# Patient Record
Sex: Female | Born: 1980 | Race: White | Hispanic: No | Marital: Married | Smoking: Never smoker
Health system: Southern US, Community
[De-identification: ages and names within clinical notes are randomized; demographics above are authoritative.]

## PROBLEM LIST (undated history)

## (undated) DIAGNOSIS — D649 Anemia, unspecified: Secondary | ICD-10-CM

## (undated) DIAGNOSIS — D693 Immune thrombocytopenic purpura: Secondary | ICD-10-CM

## (undated) DIAGNOSIS — J45909 Unspecified asthma, uncomplicated: Secondary | ICD-10-CM

## (undated) HISTORY — PX: APPENDECTOMY: SHX54

## (undated) HISTORY — PX: SPLENECTOMY, TOTAL: SHX788

## (undated) HISTORY — DX: Anemia, unspecified: D64.9

## (undated) HISTORY — PX: TONSILLECTOMY: SUR1361

## (undated) HISTORY — DX: Immune thrombocytopenic purpura: D69.3

---

## 2006-08-18 ENCOUNTER — Ambulatory Visit: Payer: Self-pay | Admitting: Internal Medicine

## 2006-09-02 ENCOUNTER — Ambulatory Visit: Payer: Self-pay | Admitting: Internal Medicine

## 2006-10-01 ENCOUNTER — Ambulatory Visit: Payer: Self-pay | Admitting: Internal Medicine

## 2006-10-05 ENCOUNTER — Ambulatory Visit: Payer: Self-pay | Admitting: Internal Medicine

## 2006-11-01 ENCOUNTER — Ambulatory Visit: Payer: Self-pay | Admitting: Internal Medicine

## 2007-03-03 ENCOUNTER — Ambulatory Visit: Payer: Self-pay | Admitting: Internal Medicine

## 2007-03-07 ENCOUNTER — Ambulatory Visit: Payer: Self-pay | Admitting: Internal Medicine

## 2007-04-03 ENCOUNTER — Ambulatory Visit: Payer: Self-pay | Admitting: Internal Medicine

## 2007-05-13 ENCOUNTER — Emergency Department: Payer: Self-pay

## 2007-09-03 ENCOUNTER — Ambulatory Visit: Payer: Self-pay | Admitting: Internal Medicine

## 2007-09-07 ENCOUNTER — Ambulatory Visit: Payer: Self-pay | Admitting: Internal Medicine

## 2007-10-01 ENCOUNTER — Ambulatory Visit: Payer: Self-pay | Admitting: Internal Medicine

## 2008-01-01 ENCOUNTER — Ambulatory Visit: Payer: Self-pay | Admitting: Internal Medicine

## 2008-01-05 ENCOUNTER — Ambulatory Visit: Payer: Self-pay | Admitting: Internal Medicine

## 2008-01-31 ENCOUNTER — Ambulatory Visit: Payer: Self-pay | Admitting: Internal Medicine

## 2008-03-02 ENCOUNTER — Ambulatory Visit: Payer: Self-pay | Admitting: Internal Medicine

## 2008-04-02 ENCOUNTER — Ambulatory Visit: Payer: Self-pay | Admitting: Internal Medicine

## 2008-05-02 ENCOUNTER — Ambulatory Visit: Payer: Self-pay | Admitting: Internal Medicine

## 2008-05-22 ENCOUNTER — Ambulatory Visit: Payer: Self-pay | Admitting: Internal Medicine

## 2008-05-22 ENCOUNTER — Emergency Department: Payer: Self-pay | Admitting: Emergency Medicine

## 2008-06-02 ENCOUNTER — Ambulatory Visit: Payer: Self-pay | Admitting: Internal Medicine

## 2008-07-02 ENCOUNTER — Ambulatory Visit: Payer: Self-pay | Admitting: Internal Medicine

## 2008-08-02 ENCOUNTER — Ambulatory Visit: Payer: Self-pay | Admitting: Internal Medicine

## 2008-09-02 ENCOUNTER — Ambulatory Visit: Payer: Self-pay | Admitting: Internal Medicine

## 2008-09-03 ENCOUNTER — Ambulatory Visit: Payer: Self-pay | Admitting: Internal Medicine

## 2008-09-30 ENCOUNTER — Ambulatory Visit: Payer: Self-pay | Admitting: Internal Medicine

## 2008-10-31 ENCOUNTER — Ambulatory Visit: Payer: Self-pay | Admitting: Internal Medicine

## 2008-11-30 ENCOUNTER — Ambulatory Visit: Payer: Self-pay | Admitting: Internal Medicine

## 2008-12-31 ENCOUNTER — Ambulatory Visit: Payer: Self-pay | Admitting: Internal Medicine

## 2009-01-30 ENCOUNTER — Ambulatory Visit: Payer: Self-pay | Admitting: Internal Medicine

## 2009-03-02 ENCOUNTER — Ambulatory Visit: Payer: Self-pay | Admitting: Internal Medicine

## 2009-05-02 ENCOUNTER — Ambulatory Visit: Payer: Self-pay | Admitting: Internal Medicine

## 2009-05-06 ENCOUNTER — Ambulatory Visit: Payer: Self-pay | Admitting: Internal Medicine

## 2009-06-02 ENCOUNTER — Ambulatory Visit: Payer: Self-pay | Admitting: Internal Medicine

## 2009-07-02 ENCOUNTER — Ambulatory Visit: Payer: Self-pay | Admitting: Internal Medicine

## 2009-08-02 ENCOUNTER — Ambulatory Visit: Payer: Self-pay | Admitting: Internal Medicine

## 2009-09-02 ENCOUNTER — Ambulatory Visit: Payer: Self-pay | Admitting: Internal Medicine

## 2009-09-19 ENCOUNTER — Ambulatory Visit: Payer: Self-pay | Admitting: Internal Medicine

## 2009-09-30 ENCOUNTER — Ambulatory Visit: Payer: Self-pay | Admitting: Internal Medicine

## 2010-01-30 ENCOUNTER — Ambulatory Visit: Payer: Self-pay | Admitting: Internal Medicine

## 2010-02-12 ENCOUNTER — Ambulatory Visit: Payer: Self-pay | Admitting: Internal Medicine

## 2010-03-02 ENCOUNTER — Ambulatory Visit: Payer: Self-pay | Admitting: Internal Medicine

## 2010-05-27 ENCOUNTER — Ambulatory Visit: Payer: Self-pay | Admitting: Internal Medicine

## 2010-06-02 ENCOUNTER — Ambulatory Visit: Payer: Self-pay | Admitting: Internal Medicine

## 2010-08-10 IMAGING — CT CT HEAD WITHOUT CONTRAST
1 series · 15 of 30 positions shown, 19 images · non-contrast
Comparison: none

REASON FOR EXAM: frontal HA     low platelets   CALL report    [id number]
COMMENTS:

[Series 2: soft tissue · axial · 0.41mm/px · z∈[-33,+102]mm · 15 of 31 slices shown, 19 images]
[im 2/31  brain]
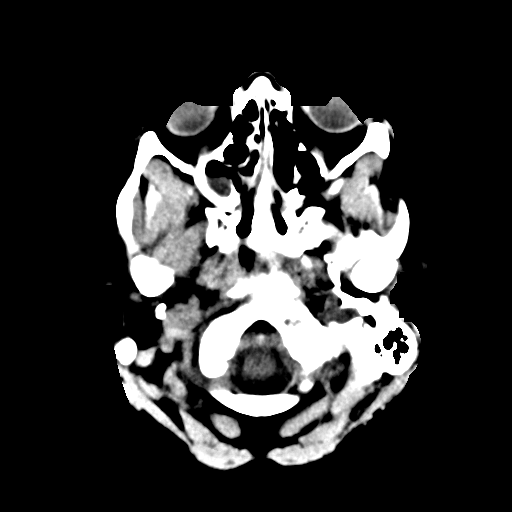
[im 2/31  bone]
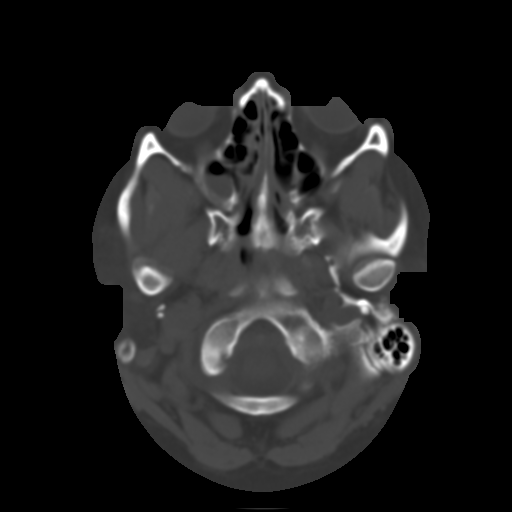
[im 4/31  brain]
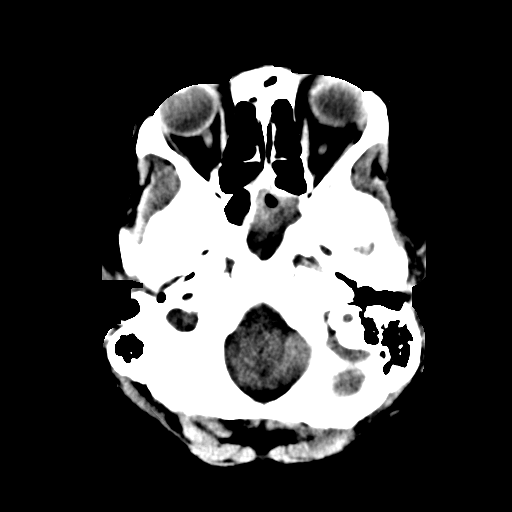
[im 6/31  brain]
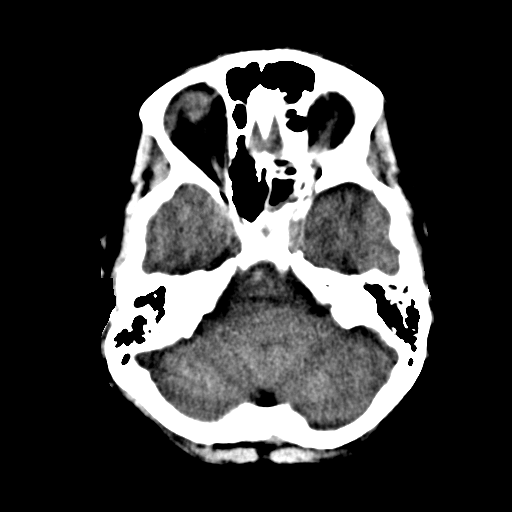
[im 8/31  brain]
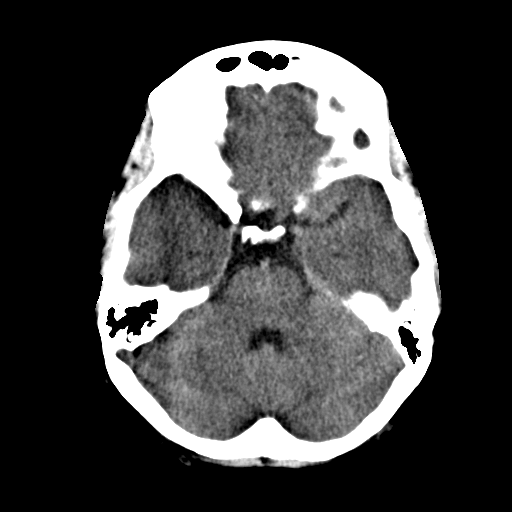
[im 10/31  brain]
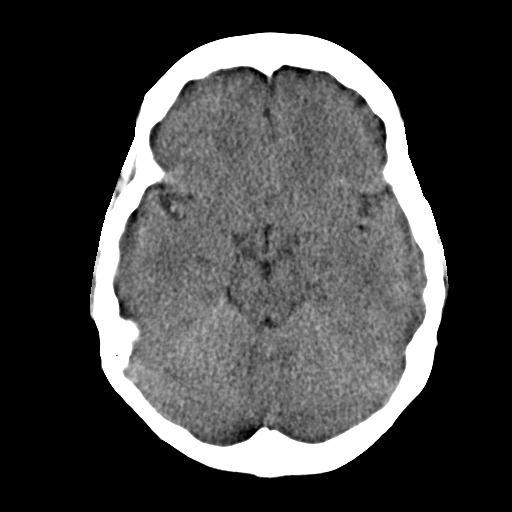
[im 10/31  bone]
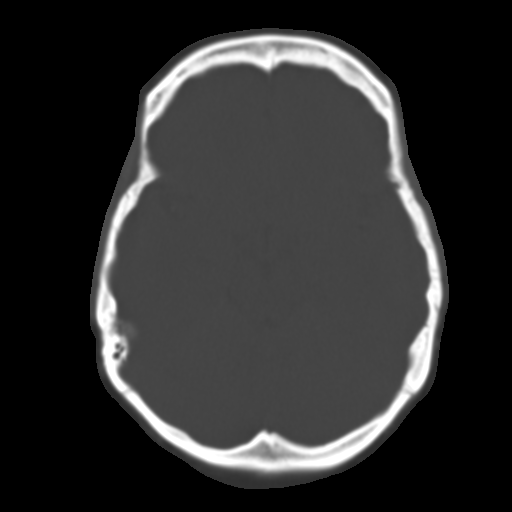
[im 12/31  brain]
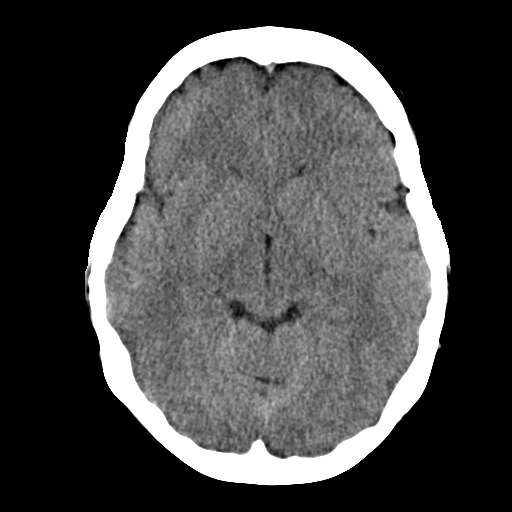
[im 14/31  brain]
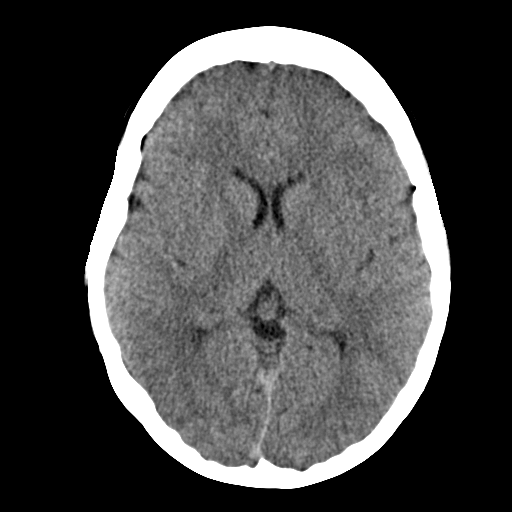
[im 16/31  brain]
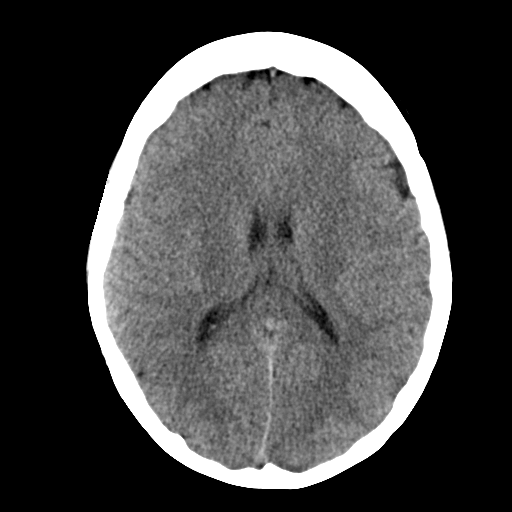
[im 17/31  brain]
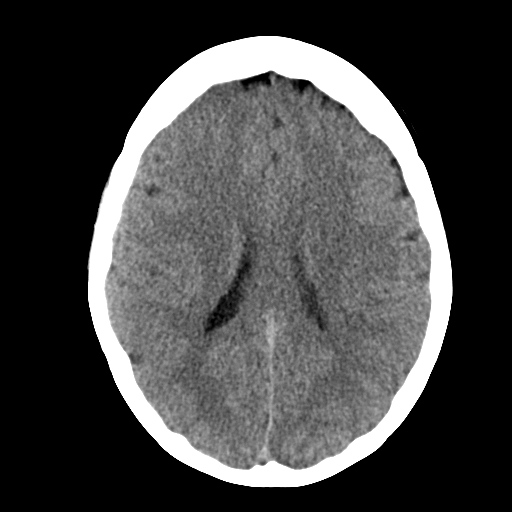
[im 17/31  bone]
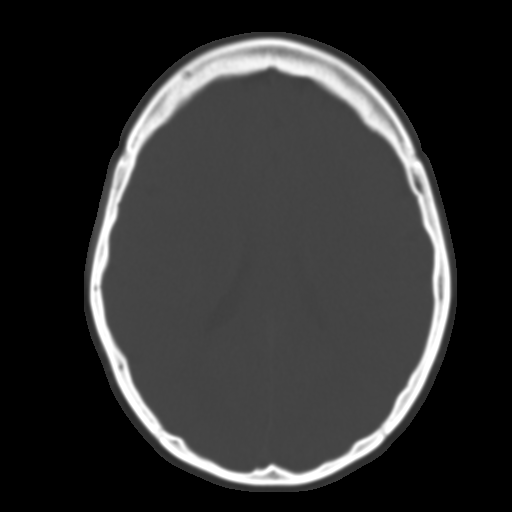
[im 19/31  brain]
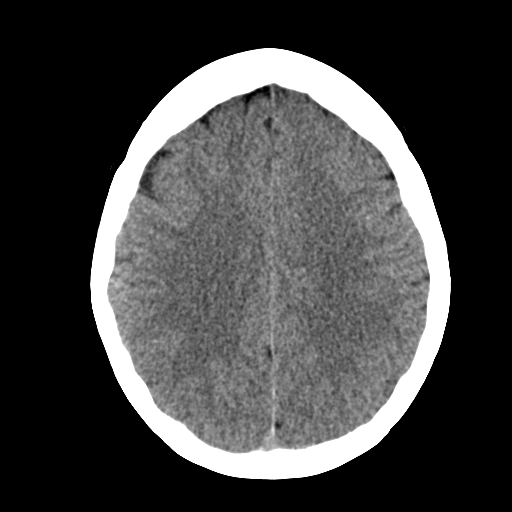
[im 21/31  brain]
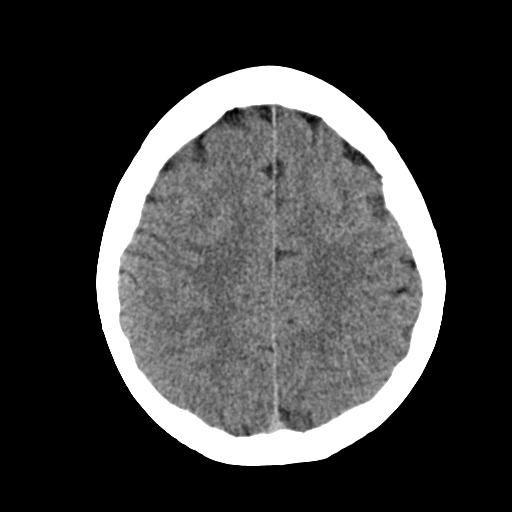
[im 23/31  brain]
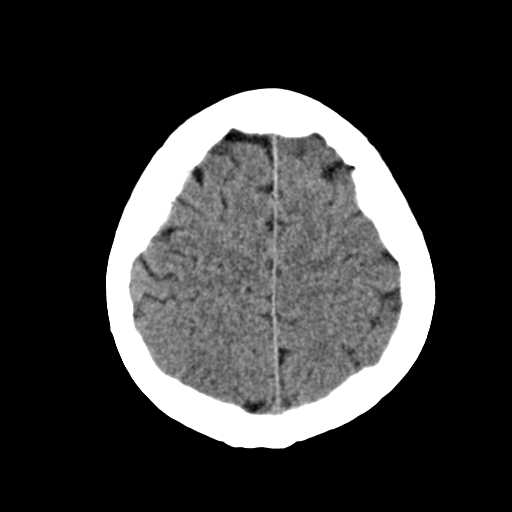
[im 25/31  brain]
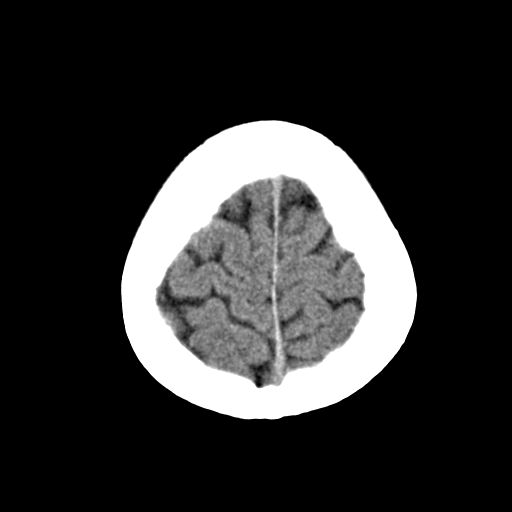
[im 25/31  bone]
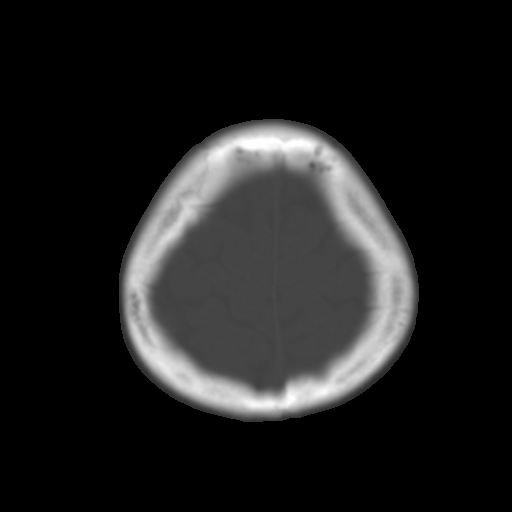
[im 27/31  brain]
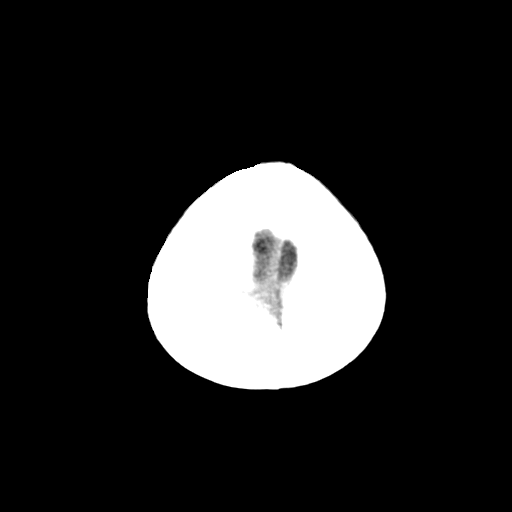
[im 29/31  brain]
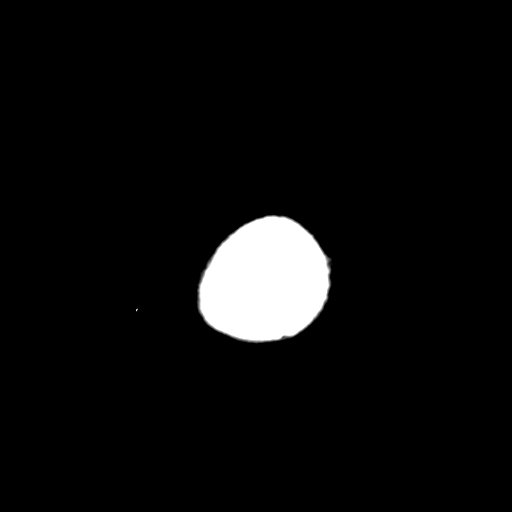

[15 of 30 positions shown; findings below may reference images not displayed]

PROCEDURE:     CT  - CT HEAD WITHOUT CONTRAST  - July 07, 2009  [DATE]

RESULT:     Axial noncontrast CT scanning was performed through the brain at
5 mm intervals and slice thicknesses.

The ventricles are normal in size and position. There is no intracranial
hemorrhage nor intracranial mass effect. The cerebellum and brainstem are
normal in density. There is no evidence of an evolving ischemic infarction.
At bone window settings there is an air-fluid level in the right maxillary
sinus. The sphenoid sinus cells are nearly totally opacified with soft
tissue density material. There is partial opacification of ethmoid sinus
cells. The frontal sinuses are clear as is the visualized portion of the
left maxillary sinus. The mastoid air cells are well pneumatized.
IMPRESSION: 1. I see no acute abnormality of the brain.
2. There is evidence of acute sinusitis with air-fluid levels in the right
maxillary sinus and involvement of the ethmoid and sphenoid sinuses.

This report was called to Jeydrik, RN, at the [HOSPITAL] [HOSPITAL] [DATE]
p.m. on 07 July, 2009.

## 2010-08-31 ENCOUNTER — Ambulatory Visit: Payer: Self-pay | Admitting: Internal Medicine

## 2010-09-30 ENCOUNTER — Ambulatory Visit: Payer: Self-pay | Admitting: Internal Medicine

## 2010-10-01 ENCOUNTER — Ambulatory Visit: Payer: Self-pay | Admitting: Internal Medicine

## 2014-02-25 ENCOUNTER — Ambulatory Visit: Payer: Self-pay

## 2014-02-25 LAB — RAPID STREP-A WITH REFLX: Micro Text Report: POSITIVE

## 2016-03-13 ENCOUNTER — Ambulatory Visit
Admission: EM | Admit: 2016-03-13 | Discharge: 2016-03-13 | Disposition: A | Discharge status: Home / Self Care | Payer: BC Managed Care – PPO

## 2016-03-13 ENCOUNTER — Ambulatory Visit (INDEPENDENT_AMBULATORY_CARE_PROVIDER_SITE_OTHER): Payer: BC Managed Care – PPO

## 2016-03-13 DIAGNOSIS — J45901 Unspecified asthma with (acute) exacerbation: Secondary | ICD-10-CM

## 2016-03-13 HISTORY — DX: Unspecified asthma, uncomplicated: J45.909

## 2016-03-13 MED ORDER — IPRATROPIUM-ALBUTEROL 0.5-2.5 (3) MG/3ML IN SOLN
3.0000 mL | Freq: Four times a day (QID) | RESPIRATORY_TRACT | Status: DC
  Administered 2016-03-13: 3 mL via RESPIRATORY_TRACT

## 2016-03-13 MED ORDER — AZITHROMYCIN 250 MG PO TABS
ORAL_TABLET | ORAL | 0 refills | Status: DC
Start: 2016-03-13 — End: 2016-03-31

## 2016-03-13 MED ORDER — PREDNISONE 10 MG PO TABS: ORAL_TABLET | ORAL | 0 refills | Status: DC

## 2016-03-13 MED ORDER — HYDROCOD POLST-CPM POLST ER 10-8 MG/5ML PO SUER: 5.0000 mL | Freq: Every evening | ORAL | 0 refills | Status: DC | PRN

## 2016-03-13 MED ORDER — BENZONATATE 100 MG PO CAPS: 100.0000 mg | ORAL_CAPSULE | Freq: Three times a day (TID) | ORAL | 0 refills | Status: DC | PRN

## 2016-03-13 NOTE — ED Provider Notes (Signed)
[facility] ____________________________________________  Time seen: Approximately 2:13 PM  I have reviewed the triage vital signs and the nursing notes.   HISTORY  Chief Complaint Asthma   HPI Leah Williams is a 35 y.o. female presents for the complaints of runny nose, nasal congestion and cough but isn't present for the last 2 weeks. Patient reports in the last week her cough has increased with intermittent wheezing. Patient reports that she does have a chronic history of asthma which can flare up when she has a upper respiratory infection. Patient reports that she has been hearing herself wheeze more over the last 2 days. Patient reports her home albuterol inhaler does help but does not fully resolve the wheezing.  Reports continues to eat and drink well overall. Patient reports cough does often wake her up at night. Denies chest pain, shortness of breath or chest pain with deep breath. Denies recent surgeries, hospitalizations or trips. Denies any back pain, hemoptysis, abdominal pain, chest or vaginal complaints. Patient reports that she was recently around 2 friends with similar symptoms.  [facility]: PCP  Patient's last menstrual period was 02/23/2016. Denies chance of pregnancy.   Past Medical History:  Diagnosis Date  . Asthma   Seasonal allergies Remission of ITP  There are no active problems to display for this patient.   Past Surgical History:  Procedure Laterality Date  . APPENDECTOMY    . SPLENECTOMY, TOTAL     Current Facility-Administered Medications:  .  ipratropium-albuterol (DUONEB) 0.5-2.5 (3) MG/3ML nebulizer solution 3 mL, 3 mL, Nebulization, Q6H, Marylene Land, NP, 3 mL at 03/13/16 1411 .  ipratropium-albuterol (DUONEB) 0.5-2.5 (3) MG/3ML nebulizer solution 3 mL, 3 mL, Nebulization, Q6H, Marylene Land, NP, 3 mL at 03/13/16 1410  Current Outpatient Prescriptions:  .  albuterol (PROVENTIL HFA;VENTOLIN HFA) 108 (90 Base)  MCG/ACT inhaler, Inhale 2 puffs into the lungs every 6 (six) hours as needed for wheezing or shortness of breath., Disp: , Rfl:  .  beclomethasone (QVAR) 40 MCG/ACT inhaler, Inhale 2 puffs into the lungs 2 (two) times daily., Disp: , Rfl:  .  fluticasone (FLONASE) 50 MCG/ACT nasal spray, Place 1 spray into both nostrils daily., Disp: , Rfl:  .  azithromycin (ZITHROMAX Z-PAK) 250 MG tablet, Take 2 tablets (500 mg) on  Day 1,  followed by 1 tablet (250 mg) once daily on Days 2 through 5., Disp: 6 each, Rfl: 0 .  benzonatate (TESSALON PERLES) 100 MG capsule, Take 1 capsule (100 mg total) by mouth 3 (three) times daily as needed for cough., Disp: 15 capsule, Rfl: 0 .  chlorpheniramine-HYDROcodone (TUSSIONEX PENNKINETIC ER) 10-8 MG/5ML SUER, Take 5 mLs by mouth at bedtime as needed. do not drive or operate machinery while taking as can cause drowsiness., Disp: 115 mL, Rfl: 0 .  predniSONE (DELTASONE) 10 MG tablet, Start 60 mg po day one, then 50 mg po day two, taper by 10 mg daily until complete., Disp: 21 tablet, Rfl: 0  Allergies Penicillins  History reviewed. No pertinent family history.  Social History Social History  Substance Use Topics  . Smoking status: Never Smoker  . Smokeless tobacco: Never Used  . Alcohol use No    Review of Systems Constitutional: No fever/chills Eyes: No visual changes. ENT: No sore throat.As above. Cardiovascular: Denies chest pain. Respiratory: Denies shortness of breath. Gastrointestinal: No abdominal pain.  No nausea, no vomiting.  No diarrhea.  No constipation. Genitourinary: Negative for dysuria. Musculoskeletal: Negative for back pain. Skin:  Negative for rash. Neurological: Negative for headaches, focal weakness or numbness.  10-point ROS otherwise negative.  ____________________________________________   PHYSICAL EXAM:  VITAL SIGNS: ED Triage Vitals  Enc Vitals Group     BP 03/13/16 1355 (!) 120/59     Pulse Rate 03/13/16 1355 96     Resp  03/13/16 1355 18     Temp 03/13/16 1355 98.7 F (37.1 C)     Temp Source 03/13/16 1355 Oral     SpO2 03/13/16 1355 96 %     Weight --      Height --      Head Circumference --      Peak Flow --      Pain Score 03/13/16 1353 0     Pain Loc --      Pain Edu? --      Excl. in [place]? --     Constitutional: Alert and oriented. Well appearing and in no acute distress. Eyes: Conjunctivae are normal. PERRL. EOMI. Head: Atraumatic. No sinus tenderness to palpation. No swelling. No erythema.  Ears: no erythema, normal TMs bilaterally.   Nose:Nasal congestion with clear rhinorrhea  Mouth/Throat: Mucous membranes are moist. No pharyngeal erythema. No tonsillar swelling or exudate.  Neck: No stridor.  No cervical spine tenderness to palpation. Hematological/Lymphatic/Immunilogical: No cervical lymphadenopathy. Cardiovascular: Normal rate, regular rhythm. Grossly normal heart sounds.  Good peripheral circulation. Respiratory: Normal respiratory effort.  No retractions. Scattered inspiratory and extra wheezes. Scattered rhonchi lower lobes bilaterally. No focal area of consolidation auscultated. Speaks in complete sentences.  Gastrointestinal: Soft and nontender. No CVA tenderness. Musculoskeletal: No lower or upper extremity tenderness nor edema. No cervical, thoracic or lumbar tenderness to palpation. No calf tenderness bilaterally. Bilateral pedal pulses equal and easily palpated. Neurologic:  Normal speech and language. No gross focal neurologic deficits are appreciated. No gait instability. Skin:  Skin is warm, dry and intact. No rash noted. Psychiatric: Mood and affect are normal. Speech and behavior are normal.   Wells score: 0 ___________________________________________   LABS (all labs ordered are listed, but only abnormal results are displayed)  Labs Reviewed - No data to display  RADIOLOGY  Dg Chest 2 View  Result Date: 03/13/2016 CLINICAL DATA:  Patient with history of asthma and  cough. Wheezing for 2 days. EXAM: CHEST  2 VIEW COMPARISON:  Chest radiograph 05/14/2007 FINDINGS: Normal cardiac and mediastinal contours. No consolidative pulmonary opacities. No pleural effusion or pneumothorax. Thoracic spine degenerative changes. IMPRESSION: No active cardiopulmonary disease. Electronically Signed   By: Lovey Newcomer M.D.   On: 03/13/2016 14:34   ____________________________________________   PROCEDURES Procedures     INITIAL IMPRESSION / ASSESSMENT AND PLAN / ED COURSE  Pertinent labs & imaging results that were available during my care of the patient were reviewed by me and considered in my medical decision making (see chart for details).  Overall well-appearing patient. No acute distress. Patient with runny nose, nasal congestion and cough for the last 2 weeks. Patient with scattered inspiratory and expiratory wheezes as well as scattered rhonchi. Will obtain chest x-ray. DuoNeb 2 in urgent care.  After albuterol nebulizer treatments in urgent care, patient reports she is feeling better. Lungs reexamined, wheezing improved but mild scattered wheezes continue, good air movement. Chest x-ray reviewed, per radiologist chest x-ray no active cardiopulmonary disease. Suspect asthmatic bronchitis. Patient reports tolerates prednisone well without any complications. Reports last on prednisone in may or June of this year for sinus issues. Will treat patient with oral  prednisone taper, azithromycin, when necessary Tessalon Perles, when necessary Tussionex and continue home albuterol inhaler as needed. Encourage close PCP follow-up this week. Encourage rest, fluids. Discussed indication, risks and benefits of medications with patient. Discussed close return parameters.   Discussed follow up with Primary care physician this week. Discussed follow up and return parameters including chest pain contracts of breath, fevers, continued wheezing, no resolution or any worsening concerns.  Patient verbalized understanding and agreed to plan.   ____________________________________________   FINAL CLINICAL IMPRESSION(S) / ED DIAGNOSES  Final diagnoses:  Asthmatic bronchitis, unspecified asthma severity, with acute exacerbation     Discharge Medication List as of 03/13/2016  2:48 PM    START taking these medications   Details  azithromycin (ZITHROMAX Z-PAK) 250 MG tablet Take 2 tablets (500 mg) on  Day 1,  followed by 1 tablet (250 mg) once daily on Days 2 through 5., Normal    benzonatate (TESSALON PERLES) 100 MG capsule Take 1 capsule (100 mg total) by mouth 3 (three) times daily as needed for cough., Starting Sat 03/13/2016, Normal    chlorpheniramine-HYDROcodone (TUSSIONEX PENNKINETIC ER) 10-8 MG/5ML SUER Take 5 mLs by mouth at bedtime as needed. do not drive or operate machinery while taking as can cause drowsiness., Starting Sat 03/13/2016, Print    predniSONE (DELTASONE) 10 MG tablet Start 60 mg po day one, then 50 mg po day two, taper by 10 mg daily until complete., Normal        Note: This dictation was prepared with Dragon dictation along with smaller phrase technology. Any transcriptional errors that result from this process are unintentional.    Clinical Course      Marylene Land, NP 03/13/16 1514    Marylene Land, NP 03/13/16 1516

## 2016-03-13 NOTE — ED Triage Notes (Addendum)
Patients complains of nasal congestion for about 2 weeks, she has asthma and has been using her inhaler more often the past 2 days. She states her lungs feel heavy and wet.

## 2016-03-13 NOTE — Discharge Instructions (Signed)
Take medication as prescribed. Rest. Drink plenty of fluids. Use home albuterol as needed for wheezing.   Follow up with your primary care physician this week .   Return to Urgent care or ER for chest pain, shortness of breath, fevers, new or worsening concerns.

## 2016-03-30 NOTE — Progress Notes (Signed)
[facility] day:  03/31/2016  Chief Complaint: Leah Williams is a 35 y.o. female with lymphocytosis who is referred in consultation by Dr. Johny Drilling.  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  She was diagnosed in [place] in 1997. Initial platelet count was 18,000. She had episodes of thrombocytopenia requiring steroids. In 1998 while hospitalized with appendicitis and severe thrombocytopenia.  She received a clear liquid (questionable IVIG).  In 2006, he was seen by Dr. Loistine Simas at [facility].  In 07/2009, she was seen by  Dr. Sterling Big for preconception planning.  At that time, platelets were197,000.  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  Initially post splenectomy, platelet count was > 1 million.  Platelet count has subsequently remained elevated (487,000 -567,000).   The patient notes a history of iron deficiency without anemia. On 07/15/2013 during pregnancy, she was noted to have a ferritin of 3.  She was unable to tolerate oral iron.  She received IV iron sucrose from 08/16/2013 -  09/06/2013.  She states that iron pills "never boosted me".  She was last seen by Dr.Oluwatoyosi Onwuemene at [facility] on 08/29/2014.  At that time, she had stable leukocytosis and thrombocytosis.  CBC included a hematocrit of 43.4, hemoglobin 14.3, platelets 534,000, white count 15,700 with an ANC of 8400.  Absolute lymphocyte count was 5300.  Absolute monocyte count was 1200. Over the past 3 years, the patient has had an absolute monocyte count ranging between 900 - 1800 (0-900).  Patient has had follow-up labs in Dr. Devona Konig office.  White count was 9,700 on 06/21/2014, 15,700 on 08/29/2014, and 24,600 on 03/15/2016. She states that the CBC in 08/2014 was following a tonsillectomy.  Labs in 03/2016 were performed on day 3 of a steroid pulse for asthma.  The patient was seen by Dr. Kym Groom on 03/15/2016 secondary to an  asthma flare. She was noted to be wheezing despite a recent prescription for azithromycin, Tussionex, Tessalon Perles and prednisone taper.  She was given a prescription for Singulair and a Combivent inhaler.  Labs were drawn secondary to history of an deficiency anemia.   CBC on 03/15/2016 revealed a hematocrit of 42.6, hemoglobin 13.8, platelets 487,000, and 24,600 with an ANC of 17,720. Differential included 72%, segs 25% lymphs, and 3% monocytes.   Absolute lymphocyte count was 6150.  Iron studies included a TIBC of 416 and 5% iron saturation.  She notes menses usually last 5 days. Menses are 2-1/2-3 weeks long. She sometimes has heavy menses lasting 7 days.  She is undergoing IUI treatment.  She denies any history of any issues with infections.  She been on steroids only a few times for her asthma.  Symptomatically, she feels pretty good.  She denies any B symptoms.   Past Medical History:  Diagnosis Date  . Anemia   . Asthma   . ITP (idiopathic thrombocytopenic purpura)     Past Surgical History:  Procedure Laterality Date  . APPENDECTOMY    . SPLENECTOMY, TOTAL      Family History  Problem Relation Age of Onset  . Skin cancer Mother   . Anemia Mother   . Hypertension Mother   . Diabetes Father   . Hypertension Father   . Hypertension Sister   . Diabetes Paternal Uncle   . Breast cancer Maternal Grandmother   . Brain cancer Maternal Grandfather   . Leukemia Paternal Grandfather    She has  a cousin with von Willebrand's disease.  She has a cousin with PKU.  Social History:  reports that she has never smoked. She has never used smokeless tobacco. She reports that she does not drink alcohol or use drugs.  She is a Licensed conveyancer.  She works with [employer] - 5th grade.  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 The patient is alone today.  Allergies:  Allergies  Allergen Reactions  . Penicillins Rash    Current Medications: Current Outpatient Prescriptions  Medication Sig  Dispense Refill  . beclomethasone (QVAR) 40 MCG/ACT inhaler Inhale 2 puffs into the lungs 2 (two) times daily.    . cetirizine (ZYRTEC) 10 MG tablet Take 10 mg by mouth daily.    . fluticasone (FLONASE) 50 MCG/ACT nasal spray Place 1 spray into both nostrils daily.    . Ipratropium-Albuterol (COMBIVENT) 20-100 MCG/ACT AERS respimat Inhale into the lungs.    . montelukast (SINGULAIR) 10 MG tablet Take by mouth.    . Prenatal Vit-Fe Fum-FA-Omega (PRENATAL MULTI +DHA PO) Take by mouth.    . valACYclovir (VALTREX) 1000 MG tablet Take by mouth.     No current facility-administered medications for this visit.     Review of Systems:  GENERAL:  Feels good.  Active.  No fevers, sweats or weight loss. PERFORMANCE STATUS (ECOG):  0 HEENT:  Allergies.  No visual changes, sore throat, mouth sores or tenderness. Lungs: No shortness of breath or cough.  No hemoptysis. Cardiac:  No chest pain, palpitations, orthopnea, or PND. GI:  No nausea, vomiting, diarrhea, constipation, melena or hematochezia. GU:  No urgency, frequency, dysuria, or hematuria. Musculoskeletal:  No back pain.  No joint pain.  No muscle tenderness. Extremities:  No pain or swelling. Skin:  No rashes or skin changes. Neuro:  Headache this week.  Nonumbness or weakness, balance or coordination issues. Endocrine:  No diabetes, thyroid issues, hot flashes or night sweats. Psych:  No mood changes, depression or anxiety. Pain:  No focal pain. Review of systems:  All other systems reviewed and found to be negative.  Physical Exam: Blood pressure 126/78, pulse 96, temperature 98.6 F (37 C), temperature source Tympanic, resp. rate 18, height 5\' 6"  (1.676 m), weight 222 lb 7.1 oz (100.9 kg), last menstrual period 02/23/2016. GENERAL:  Well developed, well nourished, sitting comfortably in the exam room in no acute distress. MENTAL STATUS:  Alert and oriented to person, place and time. HEAD:  Long brown hair.  Normocephalic, atraumatic,  face full and symmetric, no Cushingoid features. EYES:  Hazel eyes.  Pupils equal round and reactive to light and accomodation.  No conjunctivitis or scleral icterus. ENT:  Oropharynx clear without lesion.  Tongue normal. Mucous membranes moist.  RESPIRATORY:  Clear to auscultation without rales, wheezes or rhonchi. CARDIOVASCULAR:  Regular rate and rhythm without murmur, rub or gallop. ABDOMEN:  Well healed laparascopic incisions/p splenectomy.  Abdominal striae.  Soft, non-tender, with active bowel sounds, and no hepatomegaly.  No masses. SKIN:  No rashes, ulcers or lesions. EXTREMITIES: No edema, no skin discoloration or tenderness.  No palpable cords. LYMPH NODES: No palpable cervical, supraclavicular, axillary or inguinal adenopathy  NEUROLOGICAL: Unremarkable. PSYCH:  Appropriate.   No visits with results within 3 Day(s) from this visit.  Latest known visit with results is:  [facility] Conversion on 02/25/2014  Component Date Value Ref Range Status  . Micro Text Report 02/25/2014 POSITIVE   Final    Assessment:  Leah Williams is a 35 y.o. female with  chronic leukocytosis and thrombocytosis.  Her thrombocytosis is likely secondary to mild iron deficiency and being s/p splenectomy.  She has no history of recurrent infections.  WBC has ranged between 15,00 - 25,000 in the past year.  She has had lymphocytosis as well as borderline monocytosis.  She has been on steroids rarely.  She may have an underlying myeloproliferative disorder.  She has a istory of immune mediated thrombocytopenic purpura (ITP).  She was diagnosed in [place] in 1997. She received steroids and possible IVIG in the past.  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  Initially post splenectomy, platelet count was > 1 million.  Platelet count has subsequently remained elevated (487,000 -567,000).   She a history of iron deficiency without anemia. During pregnancy, ferritin was 3.  She received Venofer from 08/16/2013 -   09/06/2013.  She notes that oral iron is ineffective.    She has heavy menses.  She has a family history of von Willebrand's disease.  She is undergoing IUI treatment.  Symptomatically, she denies any complaints. Exam reveals no adenopathy or hepatomegaly.  Plan: 1.  Review of chronic leukocytosis and thrombocytosis. Etiology of her thrombocytosis is likely due to post splenectomy thrombocytosis and iron deficiency.  The etiology of her leukocytosis is less clear.  She has been on steroids rarely.  She has had borderline monocytosis.  She may have a myeloproliferative disorder.  Discuss work-up. 2.  Labs today:  CBC with diff, BCR-ABL, JAK2 with reflex to exon 12, flow cytometry, ferritin. 3.  Consider von Willebrand testing during heavy menses. 4.  RTC in 2 weeks for MD assessment and review of labs.   Lequita Asal, MD  03/31/2016, 9:53 AM

## 2016-03-31 ENCOUNTER — Encounter: Payer: Self-pay | Admitting: Hematology and Oncology

## 2016-03-31 ENCOUNTER — Inpatient Hospital Stay: Payer: BC Managed Care – PPO | Attending: Hematology and Oncology | Admitting: Hematology and Oncology

## 2016-03-31 ENCOUNTER — Inpatient Hospital Stay: Payer: BC Managed Care – PPO

## 2016-03-31 VITALS — BP 126/78 | HR 96 | Temp 98.6°F | Resp 18 | Ht 66.0 in | Wt 222.4 lb

## 2016-03-31 DIAGNOSIS — J45909 Unspecified asthma, uncomplicated: Secondary | ICD-10-CM

## 2016-03-31 DIAGNOSIS — D75839 Thrombocytosis, unspecified: Secondary | ICD-10-CM

## 2016-03-31 DIAGNOSIS — Z808 Family history of malignant neoplasm of other organs or systems: Secondary | ICD-10-CM | POA: Diagnosis not present

## 2016-03-31 DIAGNOSIS — N920 Excessive and frequent menstruation with regular cycle: Secondary | ICD-10-CM

## 2016-03-31 DIAGNOSIS — D72829 Elevated white blood cell count, unspecified: Secondary | ICD-10-CM

## 2016-03-31 DIAGNOSIS — D473 Essential (hemorrhagic) thrombocythemia: Secondary | ICD-10-CM

## 2016-03-31 DIAGNOSIS — Z803 Family history of malignant neoplasm of breast: Secondary | ICD-10-CM | POA: Diagnosis not present

## 2016-03-31 DIAGNOSIS — Z79899 Other long term (current) drug therapy: Secondary | ICD-10-CM | POA: Diagnosis not present

## 2016-03-31 DIAGNOSIS — Z9081 Acquired absence of spleen: Secondary | ICD-10-CM

## 2016-03-31 DIAGNOSIS — R790 Abnormal level of blood mineral: Secondary | ICD-10-CM

## 2016-03-31 DIAGNOSIS — Z806 Family history of leukemia: Secondary | ICD-10-CM

## 2016-03-31 LAB — CBC WITH DIFFERENTIAL/PLATELET
Basophils Absolute: 0.2 10*3/uL — ABNORMAL HIGH (ref 0–0.1)
Basophils Relative: 1 %
Eosinophils Absolute: 1 10*3/uL — ABNORMAL HIGH (ref 0–0.7)
Eosinophils Relative: 8 %
HCT: 42.9 % (ref 35.0–47.0)
Hemoglobin: 14.2 g/dL (ref 12.0–16.0)
Lymphocytes Relative: 22 %
Lymphs Abs: 2.8 10*3/uL (ref 1.0–3.6)
MCH: 28.6 pg (ref 26.0–34.0)
MCHC: 33.1 g/dL (ref 32.0–36.0)
MCV: 86.3 fL (ref 80.0–100.0)
Monocytes Absolute: 0.8 10*3/uL (ref 0.2–0.9)
Monocytes Relative: 6 %
Neutro Abs: 8.1 10*3/uL — ABNORMAL HIGH (ref 1.4–6.5)
Neutrophils Relative %: 63 %
Platelets: 556 10*3/uL — ABNORMAL HIGH (ref 150–440)
RBC: 4.97 MIL/uL (ref 3.80–5.20)
RDW: 13.9 % (ref 11.5–14.5)
WBC: 12.8 10*3/uL — ABNORMAL HIGH (ref 3.6–11.0)

## 2016-03-31 LAB — FERRITIN: Ferritin: 31 ng/mL (ref 11–307)

## 2016-03-31 NOTE — Progress Notes (Signed)
New patient evaluation.   

## 2016-04-05 LAB — COMP PANEL: LEUKEMIA/LYMPHOMA

## 2016-04-09 LAB — BCR-ABL1 FISH
Cells Analyzed: 200
Cells Counted: 200

## 2016-04-14 LAB — JAK2  V617F QUAL. WITH REFLEX TO EXON 12

## 2016-04-14 LAB — JAK2 EXON 12 MUTATION ANALYSIS

## 2016-04-15 ENCOUNTER — Inpatient Hospital Stay: Payer: BC Managed Care – PPO

## 2016-04-15 ENCOUNTER — Inpatient Hospital Stay: Payer: BC Managed Care – PPO | Attending: Hematology and Oncology | Admitting: Hematology and Oncology

## 2016-04-15 ENCOUNTER — Encounter (INDEPENDENT_AMBULATORY_CARE_PROVIDER_SITE_OTHER): Payer: Self-pay

## 2016-04-15 ENCOUNTER — Encounter: Payer: Self-pay | Admitting: Hematology and Oncology

## 2016-04-15 DIAGNOSIS — D473 Essential (hemorrhagic) thrombocythemia: Secondary | ICD-10-CM | POA: Diagnosis not present

## 2016-04-15 DIAGNOSIS — D693 Immune thrombocytopenic purpura: Secondary | ICD-10-CM | POA: Insufficient documentation

## 2016-04-15 DIAGNOSIS — J45909 Unspecified asthma, uncomplicated: Secondary | ICD-10-CM | POA: Diagnosis not present

## 2016-04-15 DIAGNOSIS — N920 Excessive and frequent menstruation with regular cycle: Secondary | ICD-10-CM | POA: Diagnosis not present

## 2016-04-15 DIAGNOSIS — D72829 Elevated white blood cell count, unspecified: Secondary | ICD-10-CM | POA: Insufficient documentation

## 2016-04-15 DIAGNOSIS — Z9081 Acquired absence of spleen: Secondary | ICD-10-CM | POA: Diagnosis not present

## 2016-04-15 DIAGNOSIS — Z808 Family history of malignant neoplasm of other organs or systems: Secondary | ICD-10-CM | POA: Diagnosis not present

## 2016-04-15 DIAGNOSIS — D72820 Lymphocytosis (symptomatic): Secondary | ICD-10-CM | POA: Diagnosis not present

## 2016-04-15 DIAGNOSIS — Z803 Family history of malignant neoplasm of breast: Secondary | ICD-10-CM | POA: Diagnosis not present

## 2016-04-15 DIAGNOSIS — Z79899 Other long term (current) drug therapy: Secondary | ICD-10-CM | POA: Insufficient documentation

## 2016-04-15 DIAGNOSIS — D649 Anemia, unspecified: Secondary | ICD-10-CM | POA: Diagnosis not present

## 2016-04-15 DIAGNOSIS — D75839 Thrombocytosis, unspecified: Secondary | ICD-10-CM | POA: Insufficient documentation

## 2016-04-15 LAB — IRON AND TIBC
Iron: 51 ug/dL (ref 28–170)
Saturation Ratios: 12 % (ref 10.4–31.8)
TIBC: 417 ug/dL (ref 250–450)
UIBC: 366 ug/dL

## 2016-04-15 NOTE — Progress Notes (Signed)
[facility] day:  04/15/2016  Chief Complaint: Leah Williams is a 35 y.o. female with lymphocytosis and thrombocytosis who is seen for review of work-up and discussion regarding direction of therapy.  HPI:  The patient was last seen in the hematology clinic on 03/31/2016 for initial consultation.  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  She was also noted to have a history of iron deficiency which could contribute to thrombocytosis.  She had heavy menses with a family history of von Willebrand's disease.  She had lymphocytosis as well as borderline monocytosis.  An underlying myeloproliferative disorder was considered.  She underwent a work-up.  CBC revealed a hematocrit of 42.9, hemoglobin 14.2, MCV 86.3, platelets 556,000, WBC 12,800 with an ANC of 8100.  Differential included 63% segs, 22% lymphs, 6% monocytes, and 8% eosinophils.  Flow cytometry revealed mildly increased eosinophils (7%) and no other significant immunophenotypic abnormalities.  BCR-ABL was negative. JAK2 V617F and exon 12 were negative.  Ferritin was 31.  Symptomatically, she denies any complaint.   Past Medical History:  Diagnosis Date  . Anemia   . Asthma   . ITP (idiopathic thrombocytopenic purpura)     Past Surgical History:  Procedure Laterality Date  . APPENDECTOMY    . SPLENECTOMY, TOTAL      Family History  Problem Relation Age of Onset  . Skin cancer Mother   . Anemia Mother   . Hypertension Mother   . Diabetes Father   . Hypertension Father   . Hypertension Sister   . Diabetes Paternal Uncle   . Breast cancer Maternal Grandmother   . Brain cancer Maternal Grandfather   . Leukemia Paternal Grandfather    She has a cousin with von Willebrand's disease.  She has a cousin with PKU.  Social History:  reports that she has never smoked. She has never used smokeless tobacco. She reports that she does not  drink alcohol or use drugs.  She is a Licensed conveyancer.  She works with [employer] - 5th grade.  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 The patient is alone today.  Allergies:  Allergies  Allergen Reactions  . Penicillins Rash    Current Medications: Current Outpatient Prescriptions  Medication Sig Dispense Refill  . beclomethasone (QVAR) 40 MCG/ACT inhaler Inhale 2 puffs into the lungs 2 (two) times daily.    . cetirizine (ZYRTEC) 10 MG tablet Take 10 mg by mouth daily.    . fluticasone (FLONASE) 50 MCG/ACT nasal spray Place 1 spray into both nostrils daily.    . Ipratropium-Albuterol (COMBIVENT) 20-100 MCG/ACT AERS respimat Inhale into the lungs.    . montelukast (SINGULAIR) 10 MG tablet Take by mouth.    . Prenatal Vit-Fe Fum-FA-Omega (PRENATAL MULTI +DHA PO) Take by mouth.    . valACYclovir (VALTREX) 1000 MG tablet Take by mouth.     No current facility-administered medications for this visit.     Review of Systems:  GENERAL:  Feels good.  Active.  No fevers, sweats or weight loss. PERFORMANCE STATUS (ECOG):  0 HEENT:  Allergies.  No visual changes, sore throat, mouth sores or tenderness. Lungs: No shortness of breath or cough.  No hemoptysis. Cardiac:  No chest pain, palpitations, orthopnea, or PND. GI:  No nausea, vomiting, diarrhea, constipation, melena or hematochezia. GU:  No urgency, frequency, dysuria, or hematuria. Musculoskeletal:  No back pain.  No joint pain.  No muscle tenderness. Extremities:  No  pain or swelling. Skin:  No rashes or skin changes. Neuro:  Headache this week.  Nonumbness or weakness, balance or coordination issues. Endocrine:  No diabetes, thyroid issues, hot flashes or night sweats. Psych:  No mood changes, depression or anxiety. Pain:  No focal pain. Review of systems:  All other systems reviewed and found to be negative.  Physical Exam: Blood pressure 128/78, pulse 86, temperature (!) 94.6 F (34.8 C), temperature source Tympanic, resp. rate 18, weight  222 lb 7.1 oz (100.9 kg). GENERAL:  Well developed, well nourished, sitting comfortably in the exam room in no acute distress. MENTAL STATUS:  Alert and oriented to person, place and time. HEAD:  Long brown hair.  Normocephalic, atraumatic, face full and symmetric, no Cushingoid features. EYES:  Hazel eyes. No conjunctivitis or scleral icterus. NEUROLOGICAL: Unremarkable. PSYCH:  Appropriate.   Appointment on 04/15/2016  Component Date Value Ref Range Status  . Iron 04/15/2016 51  28 - 170 ug/dL Final  . TIBC 04/15/2016 417  250 - 450 ug/dL Final  . Saturation Ratios 04/15/2016 12  10.4 - 31.8 % Final  . UIBC 04/15/2016 366  ug/dL Final    Assessment:  Leah Williams is a 36 y.o. female with chronic leukocytosis and thrombocytosis.  Her thrombocytosis is likely secondary to mild iron deficiency and being s/p splenectomy.  She has no history of recurrent infections.  WBC has ranged between 15,00 - 25,000 in the past year.  She has had lymphocytosis as well as borderline monocytosis.  She has been on steroids rarely.    She has a history of immune mediated thrombocytopenic purpura (ITP).  She was diagnosed in [place] in 1997. She received steroids and possible IVIG in the past.  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  Initially post splenectomy, platelet count was > 1 million.  Platelet count has subsequently remained elevated (487,000 -567,000).   She a history of iron deficiency without anemia. During pregnancy, ferritin was 3.  She received Venofer from 08/16/2013 -  09/06/2013.  She notes that oral iron is ineffective.   Ferritin was 31 on 03/31/2016.  Work-upon 03/31/2016 revealed a hematocrit of 42.9, hemoglobin 14.2, MCV 86.3, platelets 556,000, WBC 12,800 with an ANC of 8100.  Flow cytometry revealed mildly increased eosinophils (7%) and no other significant immunophenotypic abnormalities.  BCR-ABL, JAK2 V617F and exon 12 were negative.  She has heavy menses.  She has a  family history of von Willebrand's disease.  She is undergoing IUI treatment.  Symptomatically, she denies any complaints. Exam reveals no adenopathy or hepatomegaly.  Plan: 1.  Review work-up.  Discuss reactive thrombocytosis secondary to splenectomy.  No evidence of malignancy or myeloproliferative disorder.  She has mild eosinophilia likely secondary to her allergies.  Discuss adequate iron stores.  Patient requests additional iron testing. 2.  Labs today: iron studies. 3.  RTC prn.   Lequita Asal, MD  04/15/2016

## 2016-04-16 ENCOUNTER — Telehealth: Payer: Self-pay | Admitting: *Deleted

## 2016-04-16 NOTE — Telephone Encounter (Signed)
-----   Message from Lequita Asal, MD sent at 04/15/2016  5:22 PM EDT ----- Regarding: Please call patient  Iron studies look good.  M  ----- Message ----- From: Interface, Lab In [place] Sent: 04/15/2016   5:07 PM To: Lequita Asal, MD

## 2016-04-16 NOTE — Telephone Encounter (Signed)
Called patient and LVM that iron studies look good.

## 2016-04-16 NOTE — Telephone Encounter (Signed)
Called pt and left message that her iron studies were normal.  She can call if she has questions

## 2016-04-16 NOTE — Telephone Encounter (Signed)
-----   Message from Lequita Asal, MD sent at 04/15/2016  5:22 PM EDT ----- Regarding: Please call patient  Iron studies look good.  M  ----- Message ----- From: Interface, Lab In [place] Sent: 04/15/2016   5:07 PM To: Lequita Asal, MD

## 2016-11-02 ENCOUNTER — Ambulatory Visit
Admission: EM | Admit: 2016-11-02 | Discharge: 2016-11-02 | Disposition: A | Discharge status: Home / Self Care | Payer: BC Managed Care – PPO | Attending: Family Medicine | Admitting: Family Medicine

## 2016-11-02 DIAGNOSIS — J111 Influenza due to unidentified influenza virus with other respiratory manifestations: Secondary | ICD-10-CM

## 2016-11-02 DIAGNOSIS — R05 Cough: Secondary | ICD-10-CM | POA: Diagnosis not present

## 2016-11-02 DIAGNOSIS — R69 Illness, unspecified: Secondary | ICD-10-CM

## 2016-11-02 DIAGNOSIS — J029 Acute pharyngitis, unspecified: Secondary | ICD-10-CM

## 2016-11-02 DIAGNOSIS — J309 Allergic rhinitis, unspecified: Secondary | ICD-10-CM | POA: Diagnosis not present

## 2016-11-02 LAB — RAPID STREP SCREEN (MED CTR MEBANE ONLY): STREPTOCOCCUS, GROUP A SCREEN (DIRECT): NEGATIVE

## 2016-11-02 MED ORDER — PREDNISONE 20 MG PO TABS: 40.0000 mg | ORAL_TABLET | Freq: Every day | ORAL | 0 refills | Status: DC

## 2016-11-02 MED ORDER — OSELTAMIVIR PHOSPHATE 75 MG PO CAPS: 75.0000 mg | ORAL_CAPSULE | Freq: Two times a day (BID) | ORAL | 0 refills | Status: DC

## 2016-11-02 NOTE — ED Triage Notes (Signed)
Pt is here with complaint of sore throat, fever, chills. Her husband was dx with the flu on Sunday.

## 2016-11-02 NOTE — Discharge Instructions (Signed)
Take medication as prescribed. Rest. Drink plenty of fluids.  ° °Follow up with your primary care physician this week as needed. Return to Urgent care for new or worsening concerns.  ° °

## 2016-11-02 NOTE — ED Provider Notes (Signed)
[facility] ____________________________________________  Time seen: Approximately 11:30 AM  I have reviewed the triage vital signs and the nursing notes.   HISTORY  Chief Complaint Influenza   HPI Leah Williams is a 36 y.o. female present for complaints of sore throat, cough, fever and body aches since last night. Patient also reports she has been having some issues with her allergies over the last few days as well and states she felt like she has been wheezing some with her asthma. Patient states her allergies and her asthma field consistent with her baseline allergy and asthma, but reports other symptoms are different as of last night. Patient reports her husband is at home currently with the flu. Reports fever maximum 101 orally this morning. Reports to take ibuprofen prior to arrival. Reports did use home albuterol inhaler last night. Patient does report chronic history of wheezing with her allergies flaring up. Denies sinus pain, sinus pressure or productive cough. States cough is mild. Denies any accompanying shortness of breath or chest pain.  Denies chest pain, shortness of breath, abdominal pain, dysuria, extremity pain, extremity swelling or rash. Denies recent sickness. Denies recent antibiotic use.   Last menstrual: 1 week ago. Denies pregnancy.     Past Medical History:  Diagnosis Date  . Anemia   . Asthma   . ITP (idiopathic thrombocytopenic purpura)     Patient Active Problem List   Diagnosis Date Noted  . Thrombocytosis ([place]) 04/15/2016  . Leukocytosis 04/15/2016    Past Surgical History:  Procedure Laterality Date  . APPENDECTOMY    . SPLENECTOMY, TOTAL       No current facility-administered medications for this encounter.   Current Outpatient Prescriptions:  .  beclomethasone (QVAR) 40 MCG/ACT inhaler, Inhale 2 puffs into the lungs 2 (two) times daily., Disp: , Rfl:  .  Ipratropium-Albuterol (COMBIVENT) 20-100 MCG/ACT AERS respimat,  Inhale into the lungs., Disp: , Rfl:  .  valACYclovir (VALTREX) 1000 MG tablet, Take by mouth., Disp: , Rfl:  .  cetirizine (ZYRTEC) 10 MG tablet, Take 10 mg by mouth daily., Disp: , Rfl:  .  fluticasone (FLONASE) 50 MCG/ACT nasal spray, Place 1 spray into both nostrils daily., Disp: , Rfl:  .  montelukast (SINGULAIR) 10 MG tablet, Take by mouth., Disp: , Rfl:  .  oseltamivir (TAMIFLU) 75 MG capsule, Take 1 capsule (75 mg total) by mouth every 12 (twelve) hours., Disp: 10 capsule, Rfl: 0 .  predniSONE (DELTASONE) 20 MG tablet, Take 2 tablets (40 mg total) by mouth daily., Disp: 10 tablet, Rfl: 0 .  Prenatal Vit-Fe Fum-FA-Omega (PRENATAL MULTI +DHA PO), Take by mouth., Disp: , Rfl:   Allergies Penicillins  Family History  Problem Relation Age of Onset  . Skin cancer Mother   . Anemia Mother   . Hypertension Mother   . Diabetes Father   . Hypertension Father   . Hypertension Sister   . Diabetes Paternal Uncle   . Breast cancer Maternal Grandmother   . Brain cancer Maternal Grandfather   . Leukemia Paternal Grandfather     Social History Social History  Substance Use Topics  . Smoking status: Never Smoker  . Smokeless tobacco: Never Used  . Alcohol use No    Review of Systems Constitutional: As above. Eyes: No visual changes. ENT: As above. Cardiovascular: Denies chest pain. Respiratory: Denies shortness of breath. Gastrointestinal: No abdominal pain.  No nausea, no vomiting.  No diarrhea.  No constipation. Genitourinary: Negative for dysuria. Musculoskeletal: Negative for  back pain. Skin: Negative for rash. Neurological: Negative for  focal weakness or numbness. ____________________________________________   PHYSICAL EXAM:  VITAL SIGNS: ED Triage Vitals  Enc Vitals Group     BP 11/02/16 1107 132/79     Pulse Rate 11/02/16 1107 (!) 108     Resp 11/02/16 1107 18     Temp 11/02/16 1107 99.2 F (37.3 C)     Temp Source 11/02/16 1107 Oral     SpO2 11/02/16 1107 100  %     Weight 11/02/16 1107 220 lb (99.8 kg)     Height 11/02/16 1107 5\' 5"  (1.651 m)     Head Circumference --      Peak Flow --      Pain Score 11/02/16 1108 5     Pain Loc --      Pain Edu? --      Excl. in [place]? --     Constitutional: Alert and oriented. Well appearing and in no acute distress. Eyes: Conjunctivae are normal. PERRL. EOMI. Head: Atraumatic. No sinus tenderness to palpation. No swelling. No erythema.  Ears: no erythema, normal TMs bilaterally.   Nose: Mild nasal congestion.  Mouth/Throat: Mucous membranes are moist. Mild pharyngeal erythema. No tonsillar swelling or exudate.  Neck: No stridor.  No cervical spine tenderness to palpation. Hematological/Lymphatic/Immunilogical: No cervical lymphadenopathy. Cardiovascular: Normal rate, regular rhythm. Grossly normal heart sounds.  Good peripheral circulation. Respiratory: Normal respiratory effort.  No retractions. Mild scattered inspiratory wheezing. No rhonchi. No focal area of consolidation. Good air movement. Speaks in complete sentences. Occasional dry cough noted in room. Gastrointestinal: Soft and nontender.No CVA tenderness. Musculoskeletal: Ambulatory with steady gait. No cervical, thoracic or lumbar tenderness to palpation. Neurologic:  Normal speech and language. No gait instability. Skin:  Skin appears warm, dry and intact. No rash noted. Psychiatric: Mood and affect are normal. Speech and behavior are normal.  ___________________________________________   LABS (all labs ordered are listed, but only abnormal results are displayed)  Labs Reviewed  RAPID STREP SCREEN (NOT AT [facility])  CULTURE, GROUP A STREP [facility])    RADIOLOGY  No results found. ____________________________________________   PROCEDURES Procedures    INITIAL IMPRESSION / ASSESSMENT AND PLAN / ED COURSE  Pertinent labs & imaging results that were available during my care of the patient were reviewed by me and considered in my medical  decision making (see chart for details).  Well-appearing patient. No acute distress. Suspect influenza-like illness and discussed evaluation and treatment, will treat patient with oral Tamiflu. Also patient with chronic seasonal allergies and asthma and states that she feels like her allergies and asthma have been flaring up over the last few days. Patient does have some scattered wheezing, will treat with oral prednisone and continue home albuterol inhaler as needed. Lungs otherwise clear and no rhonchi and no focal area of consolidation. Patient states that she does not feel like she has an infection in her chest. Discussed with patient to monitor symptoms closely. No clear indication for antibiotic use at this time. Encouraged rest, fluids and supportive care.Discussed indication, risks and benefits of medications with patient.  Discussed follow up with Primary care physician this week. Discussed follow up and return parameters including no resolution or any worsening concerns. Patient verbalized understanding and agreed to plan.   ____________________________________________   FINAL CLINICAL IMPRESSION(S) / ED DIAGNOSES  Final diagnoses:  Influenza-like illness  Acute allergic rhinitis, unspecified seasonality, unspecified trigger     Discharge Medication List as of 11/02/2016 11:36  AM    START taking these medications   Details  oseltamivir (TAMIFLU) 75 MG capsule Take 1 capsule (75 mg total) by mouth every 12 (twelve) hours., Starting Tue 11/02/2016, Normal    predniSONE (DELTASONE) 20 MG tablet Take 2 tablets (40 mg total) by mouth daily., Starting Tue 11/02/2016, Normal        Note: This dictation was prepared with Dragon dictation along with smaller phrase technology. Any transcriptional errors that result from this process are unintentional.         Marylene Land, NP 11/02/16 1346

## 2016-11-05 ENCOUNTER — Encounter: Payer: Self-pay | Admitting: Emergency Medicine

## 2016-11-05 ENCOUNTER — Ambulatory Visit
Admission: EM | Admit: 2016-11-05 | Discharge: 2016-11-05 | Disposition: A | Discharge status: Home / Self Care | Payer: BC Managed Care – PPO | Attending: Emergency Medicine | Admitting: Emergency Medicine

## 2016-11-05 DIAGNOSIS — J029 Acute pharyngitis, unspecified: Secondary | ICD-10-CM | POA: Diagnosis not present

## 2016-11-05 LAB — MONONUCLEOSIS SCREEN: Mono Screen: NEGATIVE

## 2016-11-05 LAB — CBC WITH DIFFERENTIAL/PLATELET
Basophils Absolute: 0.1 10*3/uL (ref 0–0.1)
Basophils Relative: 1 %
Eosinophils Absolute: 0 10*3/uL (ref 0–0.7)
Eosinophils Relative: 0 %
HEMATOCRIT: 41.1 % (ref 35.0–47.0)
HEMOGLOBIN: 13.7 g/dL (ref 12.0–16.0)
LYMPHS PCT: 11 %
Lymphs Abs: 2.3 10*3/uL (ref 1.0–3.6)
MCH: 29.3 pg (ref 26.0–34.0)
MCHC: 33.2 g/dL (ref 32.0–36.0)
MCV: 88.2 fL (ref 80.0–100.0)
MONO ABS: 0.6 10*3/uL (ref 0.2–0.9)
MONOS PCT: 3 %
NEUTROS ABS: 18.6 10*3/uL — AB (ref 1.4–6.5)
Neutrophils Relative %: 85 %
Platelets: 565 10*3/uL — ABNORMAL HIGH (ref 150–440)
RBC: 4.66 MIL/uL (ref 3.80–5.20)
RDW: 13.4 % (ref 11.5–14.5)
WBC: 21.6 10*3/uL — ABNORMAL HIGH (ref 3.6–11.0)

## 2016-11-05 LAB — CULTURE, GROUP A STREP (THRC)

## 2016-11-05 MED ORDER — IBUPROFEN 600 MG PO TABS: 600.0000 mg | ORAL_TABLET | Freq: Four times a day (QID) | ORAL | 0 refills | Status: DC | PRN

## 2016-11-05 NOTE — ED Provider Notes (Signed)
HPI  SUBJECTIVE:  Leah Williams is a 36 y.o. female who presents with Persistent constant sore throat for the past 5 days. She describes it as "the back of my throat burning". She states that she noted blood in the back of her throat today. She has been taking 200 mg ibuprofen every 4 hours and drinking ice water which helps her symptoms. She was also started on Tamiflu and 5 day course of prednisone 40 3 days ago. She is still on the prednisone and Tamiflu. She states that lying down makes her worse. She had ear pain initially, but this has now resolved. She reports a mild headache, denies rhinorrhea, postnasal drip, nasal congestion, voice changes, muffled voice, drooling, trismus, difficulty breathing, sensation of her throat swelling shut, cough, body aches, rash. No belching, water brash or burning chest pain. No abdominal pain. No antibiotics in the past month. She took ibuprofen 200 mg within 6-8 hours of evaluation. She's never had symptoms like this before. She denies epistaxis. She has a past medical history of ITP status post splenectomy. She states that she has a chronically elevated white count and platelet count. She has a past medical history of recurrent strep status post tonsillectomy, vocal cord polyp, GERD while pregnant. No history of mono, diabetes, hypertension. LMP: 2 weeks ago. Denies the possibility of being pregnant. PND: [facility].   Was seen here 3 days ago for sore throat, cough, fever and body aches. Rapid strep, throat culture negative. Patient was treated empirically for influenza with Tamiflu, and for an asthma flare with prednisone and home albuterol inhaler.  Past Medical History:  Diagnosis Date  . Anemia   . Asthma   . ITP (idiopathic thrombocytopenic purpura)     Past Surgical History:  Procedure Laterality Date  . APPENDECTOMY    . SPLENECTOMY, TOTAL      Family History  Problem Relation Age of Onset  . Skin cancer Mother   . Anemia Mother   .  Hypertension Mother   . Diabetes Father   . Hypertension Father   . Hypertension Sister   . Diabetes Paternal Uncle   . Breast cancer Maternal Grandmother   . Brain cancer Maternal Grandfather   . Leukemia Paternal Grandfather     Social History  Substance Use Topics  . Smoking status: Never Smoker  . Smokeless tobacco: Never Used  . Alcohol use No    No current facility-administered medications for this encounter.   Current Outpatient Prescriptions:  .  beclomethasone (QVAR) 40 MCG/ACT inhaler, Inhale 2 puffs into the lungs 2 (two) times daily., Disp: , Rfl:  .  cetirizine (ZYRTEC) 10 MG tablet, Take 10 mg by mouth daily., Disp: , Rfl:  .  fluticasone (FLONASE) 50 MCG/ACT nasal spray, Place 1 spray into both nostrils daily., Disp: , Rfl:  .  ibuprofen (ADVIL,MOTRIN) 600 MG tablet, Take 1 tablet (600 mg total) by mouth every 6 (six) hours as needed., Disp: 30 tablet, Rfl: 0 .  Ipratropium-Albuterol (COMBIVENT) 20-100 MCG/ACT AERS respimat, Inhale into the lungs., Disp: , Rfl:  .  montelukast (SINGULAIR) 10 MG tablet, Take by mouth., Disp: , Rfl:  .  oseltamivir (TAMIFLU) 75 MG capsule, Take 1 capsule (75 mg total) by mouth every 12 (twelve) hours., Disp: 10 capsule, Rfl: 0 .  predniSONE (DELTASONE) 20 MG tablet, Take 2 tablets (40 mg total) by mouth daily., Disp: 10 tablet, Rfl: 0 .  Prenatal Vit-Fe Fum-FA-Omega (PRENATAL MULTI +DHA PO), Take by mouth., Disp: ,  Rfl:  .  valACYclovir (VALTREX) 1000 MG tablet, Take by mouth., Disp: , Rfl:   Allergies  Allergen Reactions  . Penicillins Rash     ROS  As noted in HPI.   Physical Exam  BP 127/66 (BP Location: Left Arm)   Pulse 89   Temp 99.1 F (37.3 C) (Oral)   Resp 16   Ht 5\' 5"  (1.651 m)   Wt 220 lb (99.8 kg)   LMP 10/22/2016 (Approximate)   SpO2 99%   BMI 36.61 kg/m   Constitutional: Well developed, well nourished, no acute distress Eyes:  EOMI, conjunctiva normal bilaterally HENT: Normocephalic,  atraumatic,mucus membranes moist TMs normal. No nasal congestion. Erythematous oropharynx, tonsils surgically absent. Uvula midline. Positive cobblestoning in scant postnasal drip. Neck: No cervical lymphadenopathy Respiratory: Normal inspiratory effort lungs clear bilaterally Cardiovascular: Normal rate GI: nondistended skin: No rash, skin intact Musculoskeletal: no deformities Neurologic: Alert & oriented x 3, no focal neuro deficits Psychiatric: Speech and behavior appropriate   ED Course   Medications - No data to display  Orders Placed This Encounter  Procedures  . CBC with Differential    Standing Status:   Standing    Number of Occurrences:   1  . Mononucleosis screen    Standing Status:   Standing    Number of Occurrences:   1    Results for orders placed or performed during the hospital encounter of 11/05/16 (from the past 24 hour(s))  CBC with Differential     Status: Abnormal   Collection Time: 11/05/16  4:44 PM  Result Value Ref Range   WBC 21.6 (H) 3.6 - 11.0 K/uL   RBC 4.66 3.80 - 5.20 MIL/uL   Hemoglobin 13.7 12.0 - 16.0 g/dL   HCT 41.1 35.0 - 47.0 %   MCV 88.2 80.0 - 100.0 fL   MCH 29.3 26.0 - 34.0 pg   MCHC 33.2 32.0 - 36.0 g/dL   RDW 13.4 11.5 - 14.5 %   Platelets 565 (H) 150 - 440 K/uL   Neutrophils Relative % 85 %   Neutro Abs 18.6 (H) 1.4 - 6.5 K/uL   Lymphocytes Relative 11 %   Lymphs Abs 2.3 1.0 - 3.6 K/uL   Monocytes Relative 3 %   Monocytes Absolute 0.6 0.2 - 0.9 K/uL   Eosinophils Relative 0 %   Eosinophils Absolute 0.0 0 - 0.7 K/uL   Basophils Relative 1 %   Basophils Absolute 0.1 0 - 0.1 K/uL  Mononucleosis screen     Status: None   Collection Time: 11/05/16  4:44 PM  Result Value Ref Range   Mono Screen NEGATIVE NEGATIVE   No results found.  ED Clinical Impression  Sore throat   ED Assessment/Plan  Previous notes, labs reviewed. As noted in history of present illness.  We'll check mono as this is in the differential. Viral  pharyngitis most likely, but GERD and sore throat from postnasal drip possibility. If her labs are unremarkable, plan to send home with ibuprofen 600 mg 1 g of Tylenol 3-4 times a day for pain, Benadryl/Maalox mixture, have her restart her Flonase and start some saline nasal irrigation to help with her allergy symptoms and finish Tamiflu and prednisone. Advised that she may try Pepcid if these measures do not help.   Mono negative. Leukocytosis noted, patient is currently on prednisone for an asthma exacerbation and she states that she has a baseline leukocytosis. Elevated platelets noted, but this is consistent with previous labs.  Discussed  labs,  MDM, plan and followup with patient. Discussed signs and symptoms that should prompt return to the ER. Patient agrees with plan.   Meds ordered this encounter  Medications  . ibuprofen (ADVIL,MOTRIN) 600 MG tablet    Sig: Take 1 tablet (600 mg total) by mouth every 6 (six) hours as needed.    Dispense:  30 tablet    Refill:  0    *This clinic note was created using [employer]. Therefore, there may be occasional mistakes despite careful proofreading.  ?   Melynda Ripple, MD 11/05/16 [phone]

## 2016-11-05 NOTE — Discharge Instructions (Signed)
your mono was negative today.  1 gram of Tylenol and 600 mg ibuprofen together 3-4 times a day as needed for pain.  Make sure you drink plenty of extra fluids.  Some people find salt water gargles and  Traditional Medicinal's "Throat Coat" tea helpful. Take 5 mL of liquid Benadryl and 5 mL of Maalox. Mix it together, and then hold it in your mouth for as long as you can and then swallow. You may do this 4 times a day.  Restart the Flonase and start some saline nasal irrigation to help with your allergies. If all these things to help, you may want to try Pepcid as reflux may be contributing to this.   Go to www.goodrx.com to look up your medications. This will give you a list of where you can find your prescriptions at the most affordable prices.

## 2016-11-05 NOTE — ED Triage Notes (Signed)
Patient c/o sore throat since Monday.  Patient states that she was seen on Tuesday and her strep test was Negative but was treated for the flu.

## 2017-04-14 ENCOUNTER — Encounter: Payer: Self-pay | Admitting: Emergency Medicine

## 2017-04-14 ENCOUNTER — Ambulatory Visit
Admission: EM | Admit: 2017-04-14 | Discharge: 2017-04-14 | Disposition: A | Discharge status: Home / Self Care | Payer: BC Managed Care – PPO | Attending: Emergency Medicine | Admitting: Emergency Medicine

## 2017-04-14 DIAGNOSIS — J4521 Mild intermittent asthma with (acute) exacerbation: Secondary | ICD-10-CM

## 2017-04-14 DIAGNOSIS — R0602 Shortness of breath: Secondary | ICD-10-CM | POA: Diagnosis not present

## 2017-04-14 DIAGNOSIS — J4531 Mild persistent asthma with (acute) exacerbation: Secondary | ICD-10-CM | POA: Diagnosis not present

## 2017-04-14 MED ORDER — ALBUTEROL SULFATE HFA 108 (90 BASE) MCG/ACT IN AERS: 1.0000 | INHALATION_SPRAY | Freq: Four times a day (QID) | RESPIRATORY_TRACT | 0 refills | Status: DC | PRN

## 2017-04-14 MED ORDER — DEXAMETHASONE 4 MG PO TABS: 16.0000 mg | ORAL_TABLET | Freq: Every day | ORAL | 0 refills | Status: DC

## 2017-04-14 MED ORDER — IPRATROPIUM-ALBUTEROL 0.5-2.5 (3) MG/3ML IN SOLN
3.0000 mL | Freq: Once | RESPIRATORY_TRACT | Status: AC
  Administered 2017-04-14: 3 mL via RESPIRATORY_TRACT

## 2017-04-14 NOTE — Discharge Instructions (Signed)
Use your peak flow meter regularly. Use albuterol inhaler as necessary for rescue. Recommend following up with your primary care physician next week.

## 2017-04-14 NOTE — ED Provider Notes (Signed)
[facility]    CSN: [id number] Arrival date & time: 04/14/17  1700     History   Chief Complaint Chief Complaint  Patient presents with  . Asthma    HPI Leah Williams is a 36 y.o. female.   HPI  Sit 36 year old female who presents with shortness of breath for 1 week. States that she worsened last night finally able to breathe easier after using her Combivent inhalers. Today she continues to have wheezing and difficulty breathing. She is O2 sat at 100% on room air at the present time. She has a history of mild persistent asthma as noted in the medical records.        Past Medical History:  Diagnosis Date  . Anemia   . Asthma   . ITP (idiopathic thrombocytopenic purpura)     Patient Active Problem List   Diagnosis Date Noted  . Thrombocytosis ([place]) 04/15/2016  . Leukocytosis 04/15/2016    Past Surgical History:  Procedure Laterality Date  . APPENDECTOMY    . SPLENECTOMY, TOTAL      OB History    No data available       Home Medications    Prior to Admission medications   Medication Sig Start Date End Date Taking? Authorizing Provider  Ipratropium-Albuterol (COMBIVENT) 20-100 MCG/ACT AERS respimat Inhale 1 puff into the lungs every 6 (six) hours.   Yes [provider]  albuterol (PROVENTIL HFA;VENTOLIN HFA) 108 (90 Base) MCG/ACT inhaler Inhale 1-2 puffs into the lungs every 6 (six) hours as needed for wheezing or shortness of breath. Use with spacer 04/14/17   Lorin Picket, PA-C  cetirizine (ZYRTEC) 10 MG tablet Take 10 mg by mouth daily.    [provider]  dexamethasone (DECADRON) 4 MG tablet Take 4 tablets (16 mg total) by mouth daily. 04/14/17   Lorin Picket, PA-C  fluticasone (FLONASE) 50 MCG/ACT nasal spray Place 1 spray into both nostrils daily.    [provider]  ibuprofen (ADVIL,MOTRIN) 600 MG tablet Take 1 tablet (600 mg total) by mouth every 6 (six) hours as needed. 11/05/16   Melynda Ripple, MD    Prenatal Vit-Fe Fum-FA-Omega (PRENATAL MULTI +DHA PO) Take by mouth.    [provider]    Family History Family History  Problem Relation Age of Onset  . Skin cancer Mother   . Anemia Mother   . Hypertension Mother   . Diabetes Father   . Hypertension Father   . Hypertension Sister   . Diabetes Paternal Uncle   . Breast cancer Maternal Grandmother   . Brain cancer Maternal Grandfather   . Leukemia Paternal Grandfather     Social History Social History  Substance Use Topics  . Smoking status: Never Smoker  . Smokeless tobacco: Never Used  . Alcohol use No     Allergies   Penicillins   Review of Systems Review of Systems  Constitutional: Positive for activity change. Negative for appetite change, chills, fatigue and fever.  Respiratory: Positive for shortness of breath and wheezing.   All other systems reviewed and are negative.    Physical Exam Triage Vital Signs ED Triage Vitals  Enc Vitals Group     BP 04/14/17 1809 117/63     Pulse Rate 04/14/17 1809 88     Resp 04/14/17 1809 16     Temp 04/14/17 1809 98.4 F (36.9 C)     Temp Source 04/14/17 1809 Oral     SpO2 04/14/17 1809 100 %  Weight 04/14/17 1807 220 lb (99.8 kg)     Height 04/14/17 1807 5\' 6"  (1.676 m)     Head Circumference --      Peak Flow --      Pain Score 04/14/17 1807 0     Pain Loc --      Pain Edu? --      Excl. in [place]? --    No data found.   Updated Vital Signs BP 117/63 (BP Location: Left Arm)   Pulse 88   Temp 98.4 F (36.9 C) (Oral)   Resp 16   Ht 5\' 6"  (1.676 m)   Wt 220 lb (99.8 kg)   LMP 03/25/2017 (Exact Date)   SpO2 100%   BMI 35.51 kg/m   Visual Acuity Right Eye Distance:   Left Eye Distance:   Bilateral Distance:    Right Eye Near:   Left Eye Near:    Bilateral Near:     Physical Exam  Constitutional: She is oriented to person, place, and time. She appears well-developed and well-nourished. No distress.  HENT:  Head: Normocephalic.   Right Ear: External ear normal.  Left Ear: External ear normal.  Nose: Nose normal.  Mouth/Throat: Oropharynx is clear and moist. No oropharyngeal exudate.  Eyes: Pupils are equal, round, and reactive to light. EOM are normal. Right eye exhibits no discharge. Left eye exhibits no discharge.  Neck: Normal range of motion.  Pulmonary/Chest: Effort normal. She has wheezes.  Musculoskeletal: Normal range of motion.  Lymphadenopathy:    She has no cervical adenopathy.  Neurological: She is alert and oriented to person, place, and time.  Skin: Skin is warm and dry. She is not diaphoretic.  Psychiatric: She has a normal mood and affect. Her behavior is normal. Judgment and thought content normal.  Nursing note and vitals reviewed.    UC Treatments / Results  Labs (all labs ordered are listed, but only abnormal results are displayed) Labs Reviewed - No data to display  EKG  EKG Interpretation None       Radiology No results found.  Procedures Procedures (including critical care time)  Medications Ordered in UC Medications  ipratropium-albuterol (DUONEB) 0.5-2.5 (3) MG/3ML nebulizer solution 3 mL (3 mLs Nebulization Given 04/14/17 1934)   Peak flow results following DuoNeb treatment 363 370 380 respectively.  Initial Impression / Assessment and Plan / UC Course  I have reviewed the triage vital signs and the nursing notes.  Pertinent labs & imaging results that were available during my care of the patient were reviewed by me and considered in my medical decision making (see chart for details).     Plan: 1. Test/x-ray results and diagnosis reviewed with patient 2. rx as per orders; risks, benefits, potential side effects reviewed with patient 3. Recommend supportive treatment with use of albuterol inhaler as a rescue inhaler as necessary use with spacer. Usually her peak flow meter frequently. Recommend following up with your primary care physician next week. 4. F/u prn if  symptoms worsen or don't improve   Final Clinical Impressions(s) / UC Diagnoses   Final diagnoses:  Mild intermittent asthma with exacerbation    New Prescriptions New Prescriptions   ALBUTEROL (PROVENTIL HFA;VENTOLIN HFA) 108 (90 BASE) MCG/ACT INHALER    Inhale 1-2 puffs into the lungs every 6 (six) hours as needed for wheezing or shortness of breath. Use with spacer   DEXAMETHASONE (DECADRON) 4 MG TABLET    Take 4 tablets (16 mg total) by mouth daily.  Controlled Substance Prescriptions [place] Controlled Substance Registry consulted? Not Applicable   Lorin Picket, PA-C 04/14/17 2012

## 2017-04-14 NOTE — ED Triage Notes (Signed)
Patient c/o SOB for a week and got worse last night.  Patient reports history of asthma.

## 2017-04-16 IMAGING — CR DG CHEST 2V
2 series · 2 of 2 positions shown · non-contrast
Comparison: Chest radiograph 05/14/2007

CLINICAL DATA: Patient with history of asthma and cough. Wheezing
for 2 days.

EXAM:
CHEST  2 VIEW

[chest pa]
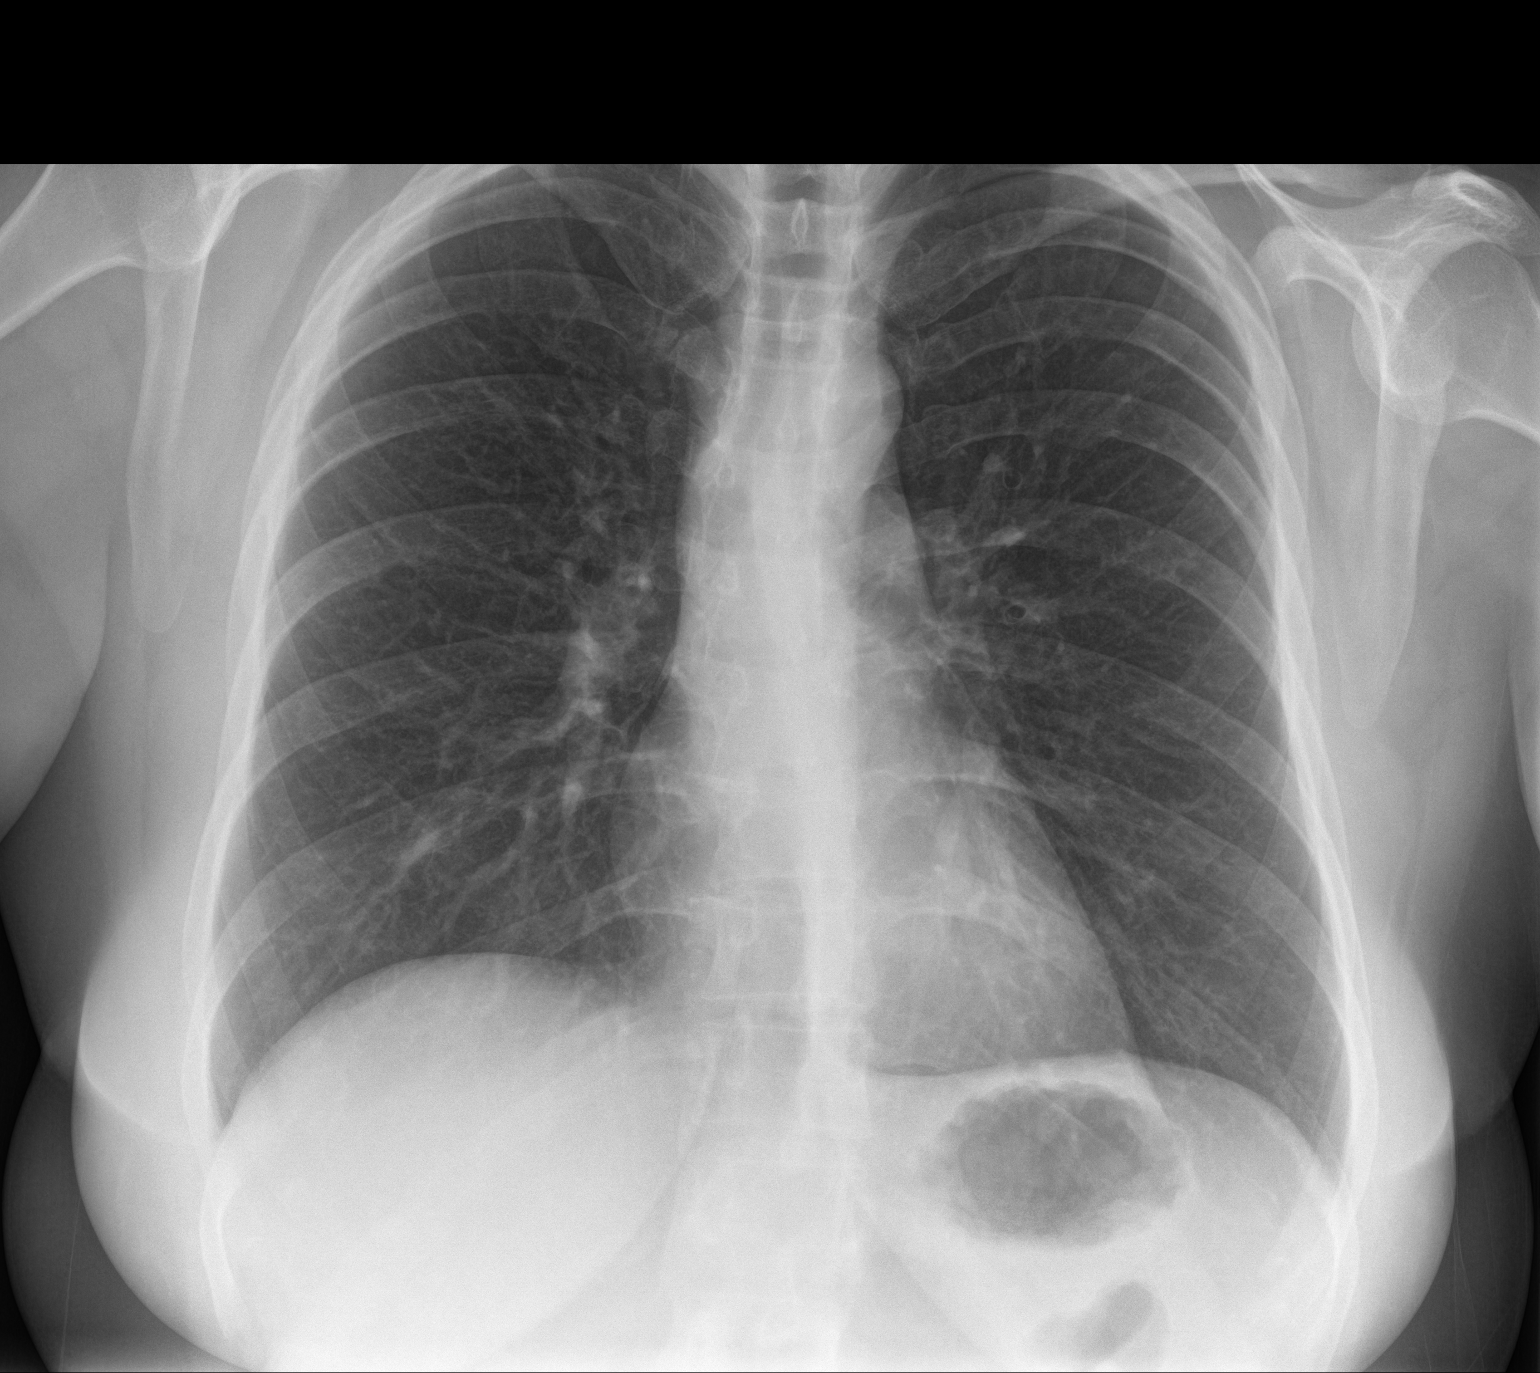

[chest lat]
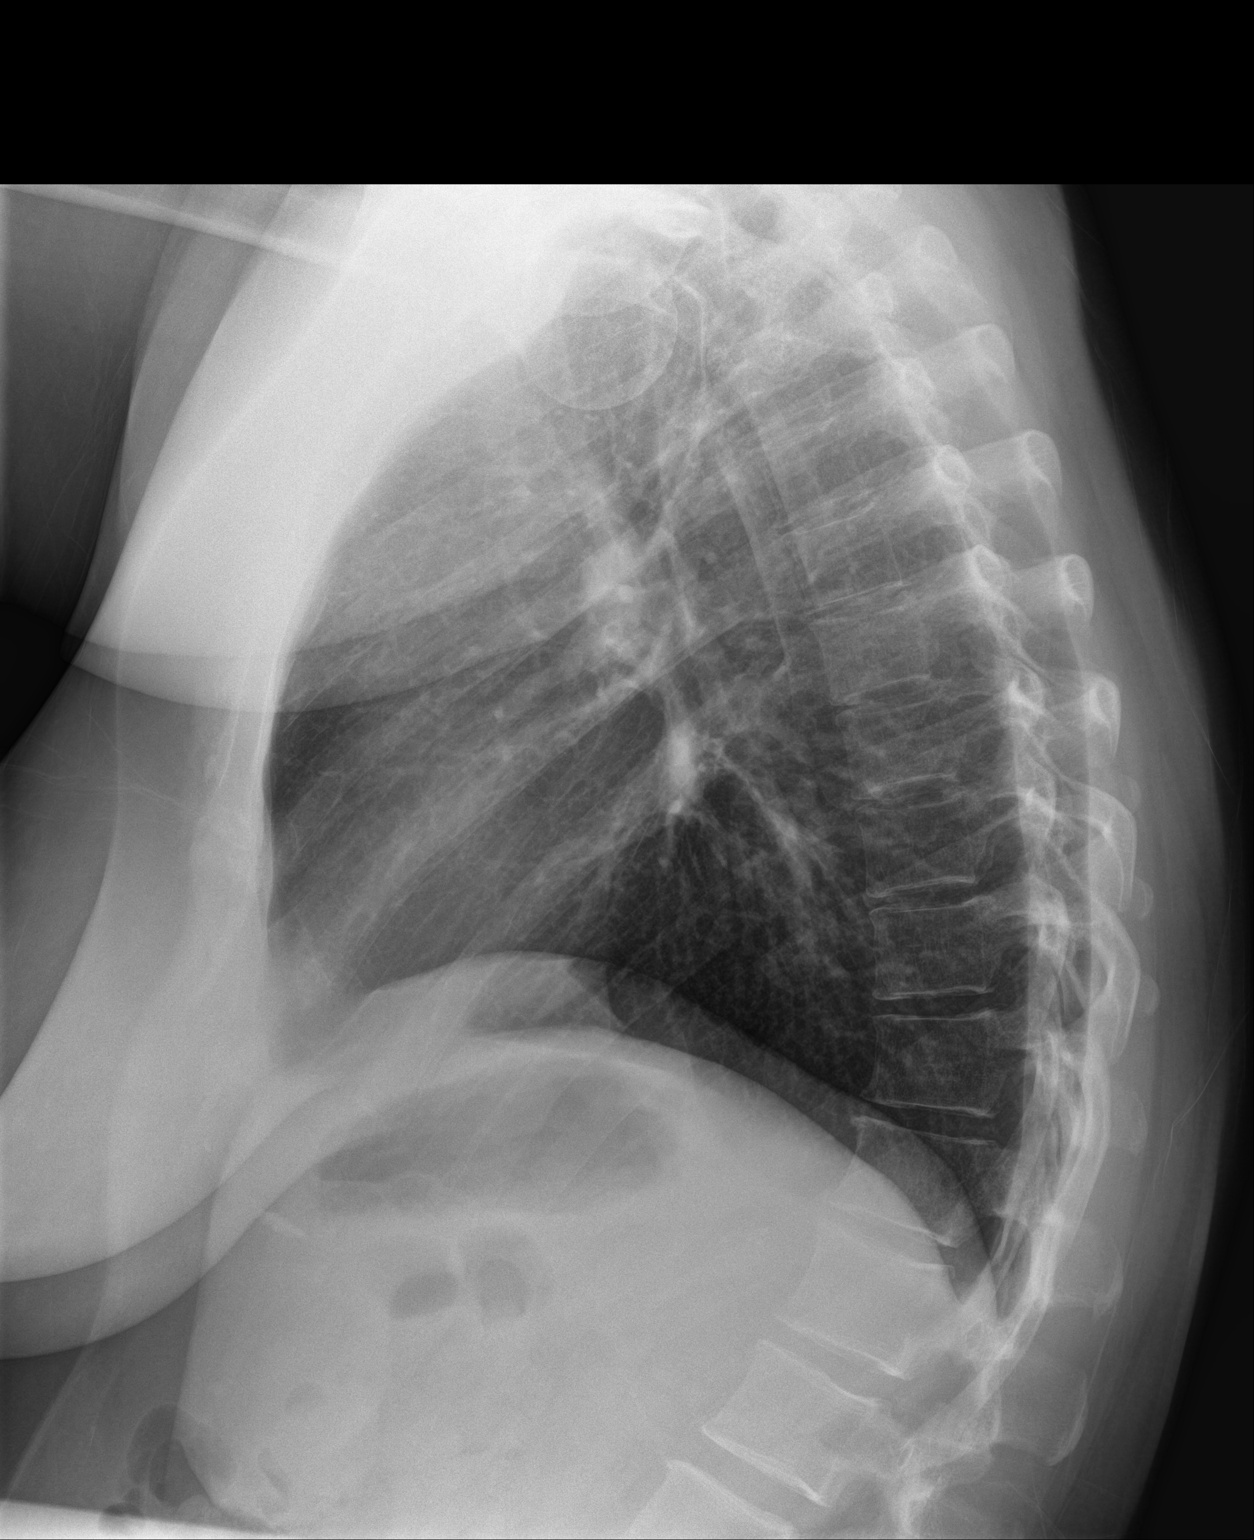

[2 of 2 positions shown; findings below may reference images not displayed]

FINDINGS: Normal cardiac and mediastinal contours. No consolidative pulmonary
opacities. No pleural effusion or pneumothorax. Thoracic spine
degenerative changes.
IMPRESSION: No active cardiopulmonary disease.

## 2017-08-19 ENCOUNTER — Ambulatory Visit
Admission: EM | Admit: 2017-08-19 | Discharge: 2017-08-19 | Disposition: A | Discharge status: Home / Self Care | Payer: BC Managed Care – PPO | Attending: Family Medicine | Admitting: Family Medicine

## 2017-08-19 ENCOUNTER — Other Ambulatory Visit: Payer: Self-pay

## 2017-08-19 DIAGNOSIS — J4521 Mild intermittent asthma with (acute) exacerbation: Secondary | ICD-10-CM | POA: Diagnosis not present

## 2017-08-19 DIAGNOSIS — R69 Illness, unspecified: Secondary | ICD-10-CM | POA: Diagnosis not present

## 2017-08-19 DIAGNOSIS — R0981 Nasal congestion: Secondary | ICD-10-CM | POA: Diagnosis not present

## 2017-08-19 DIAGNOSIS — R05 Cough: Secondary | ICD-10-CM | POA: Diagnosis not present

## 2017-08-19 DIAGNOSIS — J111 Influenza due to unidentified influenza virus with other respiratory manifestations: Secondary | ICD-10-CM

## 2017-08-19 DIAGNOSIS — J029 Acute pharyngitis, unspecified: Secondary | ICD-10-CM

## 2017-08-19 LAB — RAPID STREP SCREEN (MED CTR MEBANE ONLY): Streptococcus, Group A Screen (Direct): NEGATIVE

## 2017-08-19 MED ORDER — OSELTAMIVIR PHOSPHATE 75 MG PO CAPS: 75.0000 mg | ORAL_CAPSULE | Freq: Two times a day (BID) | ORAL | 0 refills | Status: DC

## 2017-08-19 MED ORDER — PREDNISONE 20 MG PO TABS: 40.0000 mg | ORAL_TABLET | Freq: Every day | ORAL | 0 refills | Status: DC

## 2017-08-19 NOTE — ED Triage Notes (Signed)
Patient complains of fever that started tonight. Patient states that she has been having a sore throat and cough. 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

## 2017-08-19 NOTE — ED Provider Notes (Signed)
[facility] ____________________________________________  Time seen: Approximately 7:18PM  I have reviewed the triage vital signs and the nursing notes.   HISTORY  Chief Complaint Sore Throat   HPI Leah Williams is a 37 y.o. female presenting for evaluation of sore throat, nasal congestion, cough, chills, body aches and fever that began last night into today.  States yesterday she only noticed a slight scratchy throat and some coughing.  States today's chills and body aches with just over 100 degree temperature.  States that she did take ibuprofen this afternoon.  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  Patient reports that sore throat is mild.  Also reports that she has chronic asthma with intermittent exacerbations with viral infection, and states that she currently feels like her asthma is flaring up.  Reports some intermittent wheezing today, and states that she has used her albuterol inhaler with improvement.  Denies associated chest pain, shortness of breath, hemoptysis.  States her husband has been sick and believes she is gotten sick from him.  States husband has had a quick onset of a high fever up to 103 over the last few days,   And she suspects he has the flu.  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  Reports overall continues to eat and drink well.  Denies other aggravating or alleviating factors.  Reports otherwise feels well. Denies  dysuria, extremity pain, extremity swelling or rash. Denies recent sickness. Denies recent antibiotic use.   [place]: PCP   Past Medical History:  Diagnosis Date  . Anemia   . Asthma   . ITP (idiopathic thrombocytopenic purpura)     Patient Active Problem List   Diagnosis Date Noted  . Thrombocytosis ([place]) 04/15/2016  . Leukocytosis 04/15/2016    Past Surgical History:  Procedure  Laterality Date  . APPENDECTOMY    . SPLENECTOMY, TOTAL    . TONSILLECTOMY       No current facility-administered medications for this encounter.   Current Outpatient Medications:  .  ibuprofen (ADVIL,MOTRIN) 600 MG tablet, Take 1 tablet (600 mg total) by mouth every 6 (six) hours as needed., Disp: 30 tablet, Rfl: 0 .  Ipratropium-Albuterol (COMBIVENT) 20-100 MCG/ACT AERS respimat, Inhale 1 puff into the lungs every 6 (six) hours., Disp: , Rfl:  .  Prenatal Vit-Fe Fum-FA-Omega (PRENATAL MULTI +DHA PO), Take by mouth., Disp: , Rfl:  .  oseltamivir (TAMIFLU) 75 MG capsule, Take 1 capsule (75 mg total) by mouth every 12 (twelve) hours., Disp: 10 capsule, Rfl: 0 .  predniSONE (DELTASONE) 20 MG tablet, Take 2 tablets (40 mg total) by mouth daily., Disp: 10 tablet, Rfl: 0  Allergies Dexamethasone and Penicillins  Family History  Problem Relation Age of Onset  . Skin cancer Mother   . Anemia Mother   . Hypertension Mother   . Diabetes Father   . Hypertension Father   . Hypertension Sister   . Diabetes Paternal Uncle   . Breast cancer Maternal Grandmother   . Brain cancer Maternal Grandfather   . Leukemia Paternal Grandfather     Social History Social History   Tobacco Use  . Smoking status: Never Smoker  . Smokeless tobacco: Never Used  Substance Use Topics  . Alcohol use: No  . Drug use: No    Review of Systems Constitutional: As above.  Eyes: No visual changes. ENT: AS above.  Cardiovascular: Denies chest pain. Respiratory: Denies shortness of breath. Gastrointestinal: No abdominal pain.   Genitourinary: Negative for dysuria. Musculoskeletal: Negative for back pain. Skin: Negative for rash.   ____________________________________________   PHYSICAL EXAM:  VITAL SIGNS: ED Triage Vitals  Enc Vitals Group     BP 08/19/17 1849 (!) 144/87     Pulse Rate 08/19/17 1849 (!) 116     Resp 08/19/17 1849 18     Temp 08/19/17 1849 98.7 F (37.1 C)     Temp Source  08/19/17 1849 Oral     SpO2 08/19/17 1849 100 %     Weight 08/19/17 1848 220 lb (99.8 kg)     Height 08/19/17 1848 5' 5.5" (1.664 m)     Head Circumference --      Peak Flow --      Pain Score 08/19/17 1848 7     Pain Loc --      Pain Edu? --      Excl. in [place]? --     Constitutional: Alert and oriented. Well appearing and in no acute distress. Eyes: Conjunctivae are normal.  Head: Atraumatic. No sinus tenderness to palpation. No swelling. No erythema.  Ears: no erythema, normal TMs bilaterally.   Nose:Nasal congestion   Mouth/Throat: Mucous membranes are moist. Mild pharyngeal erythema. No tonsillar swelling or exudate.  Neck: No stridor.  No cervical spine tenderness to palpation. Hematological/Lymphatic/Immunilogical: No cervical lymphadenopathy. Cardiovascular: Normal rate, regular rhythm. Grossly normal heart sounds.  Good peripheral circulation. Respiratory: Normal respiratory effort.  No retractions.  No rhonchi.  Mild scattered expiratory wheezes.  Occasional dry cough noted with bronchospasm.  Good air movement.  Gastrointestinal: Soft and nontender.  Musculoskeletal: Ambulatory with steady gait.  Neurologic:  Normal speech and language. No gait instability. Skin:  Skin appears warm, dry and intact. No rash noted. Psychiatric: Mood and affect are normal. Speech and behavior are normal.  ___________________________________________   LABS (all labs ordered are listed, but only abnormal results are displayed)  Labs Reviewed  RAPID STREP SCREEN (NOT AT [facility])  CULTURE, GROUP A STREP [facility])     PROCEDURES Procedures    INITIAL IMPRESSION / ASSESSMENT AND PLAN / ED COURSE  Pertinent labs & imaging results that were available during my care of the patient were reviewed by me and considered in my medical decision making (see chart for details).  Well-appearing patient.  No acute distress.  Quick strep negative, will culture.  Suspect viral illness, influenza-like.   Patient past medical history discussed empiric treatment with Tamiflu, patient agrees.  Will treat with oral Tamiflu.  Patient also has mild scattered wheezes, suspect asthma exacerbation with viral illness.  Continue home albuterol and will treat with prednisone.  Encourage rest, fluids, supportive care.  Discussed strict follow-up and return parameters.  Discussed follow up with Primary care physician this week. Discussed follow up and return parameters including no resolution or any worsening concerns. Patient verbalized understanding and agreed to plan.   ____________________________________________   FINAL CLINICAL IMPRESSION(S) / ED DIAGNOSES  Final diagnoses:  Influenza-like illness  Intermittent asthma with acute exacerbation, unspecified asthma severity     ED Discharge Orders        Ordered    oseltamivir (TAMIFLU) 75 MG capsule  Every 12 hours     08/19/17 1935    predniSONE (DELTASONE) 20 MG tablet  Daily     08/19/17 1935       Note: This dictation was prepared with Dragon dictation along with  smaller phrase technology. Any transcriptional errors that result from this process are unintentional.         Marylene Land, NP 08/19/17 2018

## 2017-08-19 NOTE — Discharge Instructions (Signed)
Take medication as prescribed. Rest. Drink plenty of fluids.  ° °Follow up with your primary care physician this week as needed. Return to Urgent care for new or worsening concerns.  ° °

## 2017-08-22 LAB — CULTURE, GROUP A STREP (THRC)

## 2017-09-21 ENCOUNTER — Emergency Department
Admission: EM | Admit: 2017-09-21 | Discharge: 2017-09-21 | Disposition: A | Discharge status: Home / Self Care | Payer: BC Managed Care – PPO | Attending: Student in an Organized Health Care Education/Training Program | Admitting: Student in an Organized Health Care Education/Training Program

## 2017-09-21 DIAGNOSIS — R0602 Shortness of breath: Secondary | ICD-10-CM | POA: Diagnosis present

## 2017-09-21 DIAGNOSIS — Z79899 Other long term (current) drug therapy: Secondary | ICD-10-CM | POA: Diagnosis not present

## 2017-09-21 DIAGNOSIS — J4521 Mild intermittent asthma with (acute) exacerbation: Secondary | ICD-10-CM | POA: Insufficient documentation

## 2017-09-21 MED ORDER — ALBUTEROL SULFATE HFA 108 (90 BASE) MCG/ACT IN AERS: 2.0000 | INHALATION_SPRAY | Freq: Four times a day (QID) | RESPIRATORY_TRACT | 2 refills | Status: DC | PRN

## 2017-09-21 MED ORDER — PREDNISONE 20 MG PO TABS: 40.0000 mg | ORAL_TABLET | Freq: Every day | ORAL | 0 refills | Status: AC

## 2017-09-21 MED ORDER — IPRATROPIUM-ALBUTEROL 0.5-2.5 (3) MG/3ML IN SOLN
RESPIRATORY_TRACT | Status: AC
  Filled 2017-09-21: qty 6

## 2017-09-21 NOTE — ED Triage Notes (Signed)
Pt presents today with asthma excerebration. 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

## 2017-09-21 NOTE — ED Provider Notes (Signed)
[facility] Emergency Department Provider Note    First MD Initiated Contact with Patient 09/21/17 1438     (approximate)  I have reviewed the triage vital signs and the nursing notes.   HISTORY  Chief Complaint Asthma    HPI Leah Williams is a 37 y.o. female with a history of asthma presents with sudden onset shortness of breath.  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  States that her asthma is frequently exacerbated by's perfumes and new smells.  Denies any recent fevers.  No chest pain.  States it feels identical to previous episodes of asthma exacerbation.  She was given nebulizers as well as steroids in route with improvement in symptoms.  Currently being managed by outpatient OB/GYN for IUF.  Past Medical History:  Diagnosis Date  . Anemia   . Asthma   . ITP (idiopathic thrombocytopenic purpura)    Family History  Problem Relation Age of Onset  . Skin cancer Mother   . Anemia Mother   . Hypertension Mother   . Diabetes Father   . Hypertension Father   . Hypertension Sister   . Diabetes Paternal Uncle   . Breast cancer Maternal Grandmother   . Brain cancer Maternal Grandfather   . Leukemia Paternal Grandfather    Past Surgical History:  Procedure Laterality Date  . APPENDECTOMY    . SPLENECTOMY, TOTAL    . TONSILLECTOMY     Patient Active Problem List   Diagnosis Date Noted  . Thrombocytosis ([place]) 04/15/2016  . Leukocytosis 04/15/2016      Prior to Admission medications   Medication Sig Start Date End Date Taking? Authorizing Provider  albuterol (PROVENTIL HFA;VENTOLIN HFA) 108 (90 Base) MCG/ACT inhaler Inhale 2 puffs into the lungs every 6 (six) hours as needed for wheezing or shortness of breath.   Yes [provider]  CETROTIDE 0.25 MG KIT Inject 0.25 mg into the muscle as directed. 09/09/17  Yes [provider]  GONAL-F RFF REDIJECT 300 UNIT/0.5ML  SOLN Inject 1 Dose into the muscle as directed. 09/09/17  Yes [provider]  Prenatal Vit-Fe Fum-FA-Omega (PRENATAL MULTI +DHA PO) Take 1 tablet by mouth daily.    Yes [provider]  albuterol (PROVENTIL HFA;VENTOLIN HFA) 108 (90 Base) MCG/ACT inhaler Inhale 2 puffs into the lungs every 6 (six) hours as needed for wheezing or shortness of breath. 09/21/17   Merlyn Lot, MD  ibuprofen (ADVIL,MOTRIN) 600 MG tablet Take 1 tablet (600 mg total) by mouth every 6 (six) hours as needed. Patient not taking: Reported on 09/21/2017 11/05/16   Melynda Ripple, MD  oseltamivir (TAMIFLU) 75 MG capsule Take 1 capsule (75 mg total) by mouth every 12 (twelve) hours. Patient not taking: Reported on 09/21/2017 08/19/17   Marylene Land, NP  predniSONE (DELTASONE) 20 MG tablet Take 2 tablets (40 mg total) by mouth daily. Patient not taking: Reported on 09/21/2017 08/19/17   Marylene Land, NP  predniSONE (DELTASONE) 20 MG tablet Take 2 tablets (40 mg total) by mouth daily for 5 days. 09/21/17 09/26/17  Merlyn Lot, MD    Allergies Dexamethasone and Penicillins    Social History Social History   Tobacco Use  . Smoking status: Never Smoker  . Smokeless tobacco: Never Used  Substance Use Topics  . Alcohol use: No  . Drug use: No    Review of Systems Patient denies headaches, rhinorrhea, blurry vision, numbness, shortness  of breath, chest pain, edema, cough, abdominal pain, nausea, vomiting, diarrhea, dysuria, fevers, rashes or hallucinations unless otherwise stated above in HPI. ____________________________________________   PHYSICAL EXAM:  VITAL SIGNS: Vitals:   09/21/17 1530 09/21/17 1545  BP: (!) 129/53   Pulse: (!) 118 (!) 117  Resp:    Temp:    SpO2: 100% 100%    Constitutional: Alert and oriented. Well appearing and in no acute distress. Eyes: Conjunctivae are normal.  Head: Atraumatic. Nose: No congestion/rhinnorhea. Mouth/Throat: Mucous membranes are  moist.   Neck: No stridor. Painless ROM.  Cardiovascular: Normal rate, regular rhythm. Grossly normal heart sounds.  Good peripheral circulation. Respiratory: Normal respiratory effort.  No retractions. Lungs with prolonged expiratory phase with wheeze. Gastrointestinal: Soft and nontender. No distention. No abdominal bruits. No CVA tenderness. Genitourinary:  Musculoskeletal: No lower extremity tenderness nor edema.  No joint effusions. Neurologic:  Normal speech and language. No gross focal neurologic deficits are appreciated. No facial droop Skin:  Skin is warm, dry and intact. No rash noted. Psychiatric: Mood and affect are normal. Speech and behavior are normal.  ____________________________________________   LABS (all labs ordered are listed, but only abnormal results are displayed)  No results found for this or any previous visit (from the past 24 hour(s)). ____________________________________________ ____________________________________________  [id number]   ____________________________________________   PROCEDURES  Procedure(s) performed:  Procedures    Critical Care performed: no ____________________________________________   INITIAL IMPRESSION / ASSESSMENT AND PLAN / ED COURSE  Pertinent labs & imaging results that were available during my care of the patient were reviewed by me and considered in my medical decision making (see chart for details).  DDX: Asthma, copd, CHF, pna, ptx, malignancy, Pe, anemia   [name] is a 37 y.o. who presents to the ED with exacerbation is consistent with previous episodes of asthma.  This is not clinically consistent with PE congestive heart failure or pneumonia.  Had significant improvement in symptoms after steroids as well as nebulizer treatments.  Will give additional nebulizer as she is still having some wheeze and ambulate her.    Able to ambulate without hypoxia.  Still with some wheeze but symptomatically  significantly improved.  Stable for DC home.  Have discussed with the patient and available family all diagnostics and treatments performed thus far and all questions were answered to the best of my ability. The patient demonstrates understanding and agreement with plan.  ____________________________________________   FINAL CLINICAL IMPRESSION(S) / ED DIAGNOSES  Final diagnoses:  Mild intermittent asthma with exacerbation      NEW MEDICATIONS STARTED DURING THIS VISIT:  New Prescriptions   ALBUTEROL (PROVENTIL HFA;VENTOLIN HFA) 108 (90 BASE) MCG/ACT INHALER    Inhale 2 puffs into the lungs every 6 (six) hours as needed for wheezing or shortness of breath.   PREDNISONE (DELTASONE) 20 MG TABLET    Take 2 tablets (40 mg total) by mouth daily for 5 days.     Note:  This document was prepared using Dragon voice recognition software and may include unintentional dictation errors.    Merlyn Lot, MD 09/21/17 [phone]

## 2017-11-17 ENCOUNTER — Encounter: Payer: Self-pay | Admitting: Emergency Medicine

## 2017-11-17 ENCOUNTER — Ambulatory Visit
Admission: EM | Admit: 2017-11-17 | Discharge: 2017-11-17 | Disposition: A | Discharge status: Home / Self Care | Payer: BC Managed Care – PPO | Attending: Emergency Medicine | Admitting: Emergency Medicine

## 2017-11-17 ENCOUNTER — Other Ambulatory Visit: Payer: Self-pay

## 2017-11-17 DIAGNOSIS — J45901 Unspecified asthma with (acute) exacerbation: Secondary | ICD-10-CM

## 2017-11-17 DIAGNOSIS — J4541 Moderate persistent asthma with (acute) exacerbation: Secondary | ICD-10-CM | POA: Diagnosis not present

## 2017-11-17 MED ORDER — ALBUTEROL SULFATE (2.5 MG/3ML) 0.083% IN NEBU
2.5000 mg | INHALATION_SOLUTION | Freq: Once | RESPIRATORY_TRACT | Status: AC
  Administered 2017-11-17: 2.5 mg via RESPIRATORY_TRACT

## 2017-11-17 MED ORDER — IPRATROPIUM-ALBUTEROL 0.5-2.5 (3) MG/3ML IN SOLN
3.0000 mL | Freq: Once | RESPIRATORY_TRACT | Status: AC
  Administered 2017-11-17: 3 mL via RESPIRATORY_TRACT

## 2017-11-17 MED ORDER — IBUPROFEN 600 MG PO TABS: 600.0000 mg | ORAL_TABLET | Freq: Four times a day (QID) | ORAL | 0 refills | Status: AC | PRN

## 2017-11-17 MED ORDER — ALBUTEROL SULFATE HFA 108 (90 BASE) MCG/ACT IN AERS: 1.0000 | INHALATION_SPRAY | Freq: Four times a day (QID) | RESPIRATORY_TRACT | 0 refills | Status: AC | PRN

## 2017-11-17 MED ORDER — PREDNISONE 50 MG PO TABS
60.0000 mg | ORAL_TABLET | Freq: Once | ORAL | Status: AC
  Administered 2017-11-17: 60 mg via ORAL

## 2017-11-17 MED ORDER — IPRATROPIUM-ALBUTEROL 20-100 MCG/ACT IN AERS: 2.0000 | INHALATION_SPRAY | Freq: Four times a day (QID) | RESPIRATORY_TRACT | 0 refills | Status: AC

## 2017-11-17 MED ORDER — MOMETASONE FUROATE 50 MCG/ACT NA SUSP
2.0000 | Freq: Every day | NASAL | 0 refills | Status: DC
Start: 2017-11-17 — End: 2018-08-23

## 2017-11-17 MED ORDER — PREDNISONE 10 MG (21) PO TBPK: ORAL_TABLET | ORAL | 0 refills | Status: DC

## 2017-11-17 NOTE — ED Triage Notes (Signed)
Patient in today c/o cough, sob x 1 week. Patient has asthma and thinks this is an asthma attack. Patient denies fever. Patient has tried her Combivent Inhaler without relief.

## 2017-11-17 NOTE — ED Provider Notes (Signed)
HPI  SUBJECTIVE:  Leah Williams is a 37 y.o. female  with h/o asthma with an asthma exacerbation for the past week to week and a half.  States that she is getting worse.  She has a peak flow meter and states that she has been continually in the red and low yellow zone during this time.  She reports chest tightness, wheezing, shortness of breath, dyspnea on exertion chest soreness secondary to cough.  States that she is unable to sleep at night due to the cough.  She states that her allergies are bothering her now.  She states this is identical to previous asthma attacks.  Dates that this was triggered by high pollen count.  She denies pleuritic chest pain, hemoptysis, calf pain, swelling, surgery in the past 4 weeks, immobilization, OCP use.  She has been using her Combivent inhaler over 8 times a day with a spacer with temporary improvement in her symptoms.  Symptoms are worse with exertion, going outside.  She has a past medical history of allergies, asthma.  She was admitted to the hospital years ago, no intubations or recent steroid use.  No history of PE, DVT, CHF. PMD: [place], [facility]   Past Medical History:  Diagnosis Date  . Anemia   . Asthma   . ITP (idiopathic thrombocytopenic purpura)     Past Surgical History:  Procedure Laterality Date  . APPENDECTOMY    . SPLENECTOMY, TOTAL    . TONSILLECTOMY      Family History  Problem Relation Age of Onset  . Skin cancer Mother   . Anemia Mother   . Hypertension Mother   . Diabetes Father   . Hypertension Father   . Hypertension Sister   . Diabetes Paternal Uncle   . Breast cancer Maternal Grandmother   . Brain cancer Maternal Grandfather   . Leukemia Paternal Grandfather     Social History   Tobacco Use  . Smoking status: Never Smoker  . Smokeless tobacco: Never Used  Substance Use Topics  . Alcohol use: No  . Drug use: No    No current facility-administered medications for this encounter.   Current  Outpatient Medications:  .  valACYclovir (VALTREX) 1000 MG tablet, Take 2 tablets by mouth 2 (two) times daily as needed., Disp: , Rfl:  .  albuterol (PROVENTIL HFA;VENTOLIN HFA) 108 (90 Base) MCG/ACT inhaler, Inhale 1-2 puffs into the lungs every 6 (six) hours as needed for wheezing or shortness of breath., Disp: 1 Inhaler, Rfl: 0 .  CETROTIDE 0.25 MG KIT, Inject 0.25 mg into the muscle as directed., Disp: , Rfl: 2 .  GONAL-F RFF REDIJECT 300 UNIT/0.5ML SOLN, Inject 1 Dose into the muscle as directed., Disp: , Rfl: 2 .  ibuprofen (ADVIL,MOTRIN) 600 MG tablet, Take 1 tablet (600 mg total) by mouth every 6 (six) hours as needed., Disp: 30 tablet, Rfl: 0 .  Ipratropium-Albuterol (COMBIVENT) 20-100 MCG/ACT AERS respimat, Inhale 2 puffs into the lungs every 6 (six) hours., Disp: 1 Inhaler, Rfl: 0 .  mometasone (NASONEX) 50 MCG/ACT nasal spray, Place 2 sprays into the nose daily., Disp: 17 g, Rfl: 0 .  predniSONE (STERAPRED UNI-PAK 21 TAB) 10 MG (21) TBPK tablet, Dispense one 6 day pack. Take as directed with food., Disp: 21 tablet, Rfl: 0 .  Prenatal Vit-Fe Fum-FA-Omega (PRENATAL MULTI +DHA PO), Take 1 tablet by mouth daily. , Disp: , Rfl:   Allergies  Allergen Reactions  . Dexamethasone     Very Angry  .  Penicillins Rash    Was told not to take due to being on this medication for 90 days when she was a child     ROS  As noted in HPI.   Physical Exam  BP 113/82 (BP Location: Left Arm)   Pulse (!) 101   Temp 98.4 F (36.9 C) (Oral)   Resp 16   Ht _0  (1.651 m)   Wt 220 lb (99.8 kg)   LMP 10/24/2017 (Approximate)   SpO2 100%   BMI 36.61 kg/m   Constitutional: Well developed, well nourished, no acute distress Eyes: PERRL, EOMI, conjunctiva normal bilaterally HENT: Normocephalic, atraumatic,mucus membranes moist Respiratory: Poor air movement, prolonged expiratory phase, diffuse wheezing throughout all lung fields Cardiovascular: Mild regular tachycardia no murmurs, no gallops, no  rubs GI: nondistended Back: no CVAT skin: No rash, skin intact Musculoskeletal: Calves symmetric, nontender no edema Neurologic: Alert & oriented x 3, CN II-XII grossly intact, no motor deficits, sensation grossly intact Psychiatric: Speech and behavior appropriate   ED Course  Medications  albuterol (PROVENTIL) (2.5 MG/3ML) 0.083% nebulizer solution 2.5 mg (2.5 mg Nebulization Given 11/17/17 1949)  ipratropium-albuterol (DUONEB) 0.5-2.5 (3) MG/3ML nebulizer solution 3 mL (3 mLs Nebulization Given 11/17/17 1949)  predniSONE (DELTASONE) tablet 60 mg (60 mg Oral Given 11/17/17 1948)    Orders Placed This Encounter  Procedures  . Peak flow    Pre and post treatment    Standing Status:   Standing    Number of Occurrences:   [phone]    Order Specific Question:   Pre and post Neb    Answer:   Yes   No results found for this or any previous visit (from the past 24 hour(s)). No results found.  ED Clinical Impression  Moderate asthma with exacerbation, unspecified whether persistent  ED Assessment/Plan  Pt seen and examined. Pt with an asthma exacerbation secondary to allergies/exposure to pollen.  speaking in full sentences but has poor air movement,  wheezing throughout all lung fields, Pt given 5 mg albuterol/0.5 mg Atrovent, 60 of prednisone for asthma exacerbation. Peak flow done. No focal lung findings, no fever, will not do CXR.  Will re-evaluate.  Calculated peak flow 438.  Initial peak flow pretreatment 400/330/340 Post treatment #1: 440/450/380.  Patient states that she feels much better.  She has improved air movement, loud wheezing throughout all lung fields. . Will refill her Combivent inhaler have her use is 4 times a day and then a plain albuterol inhaler and additional 2 puffs every 4-6 hours as needed.  She already has a spacer.  We will also have her start ibuprofen 600 mg with 1 g of Tylenol together for chest wall pain, Nasonex nasal spray, saline nasal irrigation with  a Milta Deiters med sinus rinse and a prednisone 6-day taper.  Follow-up with her primary care physician as needed, to the ER if she gets worse.  Discussed MDM, plan and followup with patient. Discussed sn/sx that should prompt return to the ED. patient agrees with plan.   *This clinic note was created using Dragon dictation software. Therefore, there may be occasional mistakes despite careful proofreading.   Melynda Ripple, MD 11/17/17 2042

## 2017-11-17 NOTE — Discharge Instructions (Addendum)
Combivent inhaler 2 puffs 4 times a day and then a plain albuterol inhaler with an additional 2 puffs every 4-6 hours as needed.  10 you using a spacer.  start ibuprofen 600 mg with 1 g of Tylenol together for chest wall pain, Nasonex nasal spray, saline nasal irrigation with a Milta Deiters med sinus rinse and a prednisone 6-day taper.

## 2018-08-23 ENCOUNTER — Other Ambulatory Visit: Payer: Self-pay

## 2018-08-23 ENCOUNTER — Encounter: Payer: Self-pay | Admitting: Emergency Medicine

## 2018-08-23 ENCOUNTER — Ambulatory Visit
Admission: EM | Admit: 2018-08-23 | Discharge: 2018-08-23 | Disposition: A | Discharge status: Home / Self Care | Payer: BC Managed Care – PPO | Attending: Family Medicine | Admitting: Family Medicine

## 2018-08-23 DIAGNOSIS — R059 Cough, unspecified: Secondary | ICD-10-CM

## 2018-08-23 DIAGNOSIS — J101 Influenza due to other identified influenza virus with other respiratory manifestations: Secondary | ICD-10-CM | POA: Diagnosis not present

## 2018-08-23 DIAGNOSIS — R05 Cough: Secondary | ICD-10-CM | POA: Insufficient documentation

## 2018-08-23 DIAGNOSIS — Z8709 Personal history of other diseases of the respiratory system: Secondary | ICD-10-CM

## 2018-08-23 LAB — RAPID INFLUENZA A&B ANTIGENS (ARMC ONLY): INFLUENZA A (ARMC): NEGATIVE

## 2018-08-23 LAB — RAPID INFLUENZA A&B ANTIGENS: Influenza B (ARMC): POSITIVE — AB

## 2018-08-23 MED ORDER — PREDNISONE 20 MG PO TABS: 40.0000 mg | ORAL_TABLET | Freq: Every day | ORAL | 0 refills | Status: AC

## 2018-08-23 MED ORDER — OSELTAMIVIR PHOSPHATE 75 MG PO CAPS: 75.0000 mg | ORAL_CAPSULE | Freq: Two times a day (BID) | ORAL | 0 refills | Status: AC

## 2018-08-23 NOTE — ED Triage Notes (Signed)
Patient c/o HAs, cough, chills and fever that started early this morning.

## 2018-08-23 NOTE — ED Provider Notes (Signed)
[facility]    CSN: [id number] Arrival date & time: 08/23/18  6644     History   Chief Complaint Chief Complaint  Patient presents with  . Fever  . Cough    HPI Leah Williams is a 38 y.o. female.   38 year old female presents with chills and headache that started late last night. Woke up in the middle of the night with a fever of 101. Also having slight cough and decreased appetite but denies any nasal congestion, nausea, vomiting or diarrhea. She has taken Tylenol with some relief. 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 She did get her flu vaccine this season. She has a history of asthma and currently on Combivent and Advair daily and Albuterol prn. Also had a splenectomy and tonsillectomy so is always concerned when she develops a fever. Requests flu testing.   The history is provided by the patient.    Past Medical History:  Diagnosis Date  . Anemia   . Asthma   . ITP (idiopathic thrombocytopenic purpura)     Patient Active Problem List   Diagnosis Date Noted  . Thrombocytosis ([place]) 04/15/2016  . Leukocytosis 04/15/2016    Past Surgical History:  Procedure Laterality Date  . APPENDECTOMY    . SPLENECTOMY, TOTAL    . TONSILLECTOMY      OB History   No obstetric history on file.      Home Medications    Prior to Admission medications   Medication Sig Start Date End Date Taking? Authorizing Provider  ferrous sulfate 325 (65 FE) MG tablet Take 325 mg by mouth daily with breakfast.   Yes [provider]  Fluticasone-Salmeterol (ADVAIR) 250-50 MCG/DOSE AEPB Inhale 1 puff into the lungs 2 (two) times daily.   Yes [provider]  Ipratropium-Albuterol (COMBIVENT) 20-100 MCG/ACT AERS respimat Inhale 2 puffs into the lungs every 6 (six) hours. 11/17/17  Yes Melynda Ripple, MD  triamcinolone cream (KENALOG) 0.5 % Apply topically. 07/18/18 07/18/19 Yes [provider]    valACYclovir (VALTREX) 1000 MG tablet Take by mouth. 01/02/18 01/02/19 Yes [provider]  albuterol (PROVENTIL HFA;VENTOLIN HFA) 108 (90 Base) MCG/ACT inhaler Inhale 1-2 puffs into the lungs every 6 (six) hours as needed for wheezing or shortness of breath. 11/17/17   Melynda Ripple, MD  ibuprofen (ADVIL,MOTRIN) 600 MG tablet Take 1 tablet (600 mg total) by mouth every 6 (six) hours as needed. 11/17/17   Melynda Ripple, MD  oseltamivir (TAMIFLU) 75 MG capsule Take 1 capsule (75 mg total) by mouth every 12 (twelve) hours for 5 days. 08/23/18 08/28/18  Katy Apo, NP  predniSONE (DELTASONE) 20 MG tablet Take 2 tablets (40 mg total) by mouth daily for 5 days. 08/23/18 08/28/18  Katy Apo, NP    Family History Family History  Problem Relation Age of Onset  . Skin cancer Mother   . Anemia Mother   . Hypertension Mother   . Diabetes Father   . Hypertension Father   . Hypertension Sister   . Diabetes Paternal Uncle   . Breast cancer Maternal Grandmother   . Brain cancer Maternal Grandfather   . Leukemia Paternal Grandfather     Social History Social History   Tobacco Use  . Smoking status: Never Smoker  . Smokeless tobacco: Never Used  Substance Use Topics  . Alcohol use: No  . Drug use: No     Allergies   Dexamethasone  and Penicillins   Review of Systems Review of Systems  Constitutional: Positive for appetite change, chills, fatigue and fever. Negative for activity change.  HENT: Positive for postnasal drip. Negative for congestion, ear discharge, ear pain, facial swelling, mouth sores, nosebleeds, rhinorrhea, sinus pressure, sinus pain, sneezing, sore throat and trouble swallowing.   Eyes: Negative for pain, discharge, redness and itching.  Respiratory: Positive for cough and wheezing. Negative for chest tightness and shortness of breath.   Cardiovascular: Negative for chest pain.  Gastrointestinal: Negative for diarrhea, nausea and vomiting.   Musculoskeletal: Positive for arthralgias and myalgias. Negative for neck pain and neck stiffness.  Skin: Negative for color change, rash and wound.  Allergic/Immunologic: Positive for immunocompromised state.  Neurological: Positive for headaches. Negative for dizziness, tremors, seizures, syncope, weakness, light-headedness and numbness.  Hematological: Negative for adenopathy. Bruises/bleeds easily.     Physical Exam Triage Vital Signs ED Triage Vitals  Enc Vitals Group     BP 08/23/18 0944 135/86     Pulse Rate 08/23/18 0944 (!) 101     Resp 08/23/18 0944 16     Temp 08/23/18 0944 98.9 F (37.2 C)     Temp Source 08/23/18 0944 Oral     SpO2 08/23/18 0944 98 %     Weight 08/23/18 0938 230 lb (104.3 kg)     Height 08/23/18 0938 5\' 5"  (1.651 m)     Head Circumference --      Peak Flow --      Pain Score 08/23/18 0938 7     Pain Loc --      Pain Edu? --      Excl. in [place]? --    No data found.  Updated Vital Signs BP 135/86 (BP Location: Left Arm)   Pulse (!) 101   Temp 98.9 F (37.2 C) (Oral)   Resp 16   Ht 5\' 5"  (1.651 m)   Wt 230 lb (104.3 kg)   LMP 08/02/2018 (Approximate)   SpO2 98%   BMI 38.27 kg/m   Visual Acuity Right Eye Distance:   Left Eye Distance:   Bilateral Distance:    Right Eye Near:   Left Eye Near:    Bilateral Near:     Physical Exam Vitals signs reviewed.  Constitutional:      General: She is awake.     Appearance: Normal appearance. She is well-developed and well-groomed. She is ill-appearing.     Comments: Patient sitting comfortably on exam table in no acute distress but appears ill.  HENT:     Head: Normocephalic and atraumatic.     Right Ear: Hearing, tympanic membrane, ear canal and external ear normal.     Left Ear: Hearing, tympanic membrane, ear canal and external ear normal.     Nose: No congestion or rhinorrhea.     Right Sinus: No maxillary sinus tenderness or frontal sinus tenderness.     Left Sinus: No maxillary sinus  tenderness or frontal sinus tenderness.     Mouth/Throat:     Lips: Pink.     Mouth: Mucous membranes are moist.     Pharynx: Oropharynx is clear. Uvula midline. No posterior oropharyngeal erythema.  Eyes:     Extraocular Movements: Extraocular movements intact.     Conjunctiva/sclera: Conjunctivae normal.  Neck:     Musculoskeletal: Normal range of motion and neck supple.  Cardiovascular:     Rate and Rhythm: Regular rhythm. Tachycardia present.     Heart sounds: Normal heart sounds. No  murmur.  Pulmonary:     Effort: Pulmonary effort is normal. No accessory muscle usage or respiratory distress.     Breath sounds: Normal air entry. Examination of the right-upper field reveals wheezing. Examination of the left-upper field reveals wheezing. Wheezing present. No decreased breath sounds, rhonchi or rales.  Musculoskeletal: Normal range of motion.  Lymphadenopathy:     Cervical: No cervical adenopathy.  Skin:    General: Skin is warm and dry.     Capillary Refill: Capillary refill takes less than 2 seconds.     Findings: No rash.  Neurological:     General: No focal deficit present.     Mental Status: She is alert and oriented to person, place, and time.  Psychiatric:        Mood and Affect: Mood normal.        Behavior: Behavior normal. Behavior is cooperative.        Thought Content: Thought content normal.        Judgment: Judgment normal.      UC Treatments / Results  Labs (all labs ordered are listed, but only abnormal results are displayed) Labs Reviewed  RAPID INFLUENZA A&B ANTIGENS ([facility] ONLY) - Abnormal; Notable for the following components:      Result Value   Influenza B ([facility]) POSITIVE (*)    All other components within normal limits    EKG None  Radiology No results found.  Procedures Procedures (including critical care time)  Medications Ordered in UC Medications - No data to display  Initial Impression / Assessment and Plan / UC Course  I have  reviewed the triage vital signs and the nursing notes.  Pertinent labs & imaging results that were available during my care of the patient were reviewed by me and considered in my medical decision making (see chart for details).    Reviewed positive influenza B test result with patient. Discussed treatment options. Patient desires antiviral medication. Recommend start Tamiflu 75mg  twice a day as directed. Continue Tylenol 1000mg  every 8 hours as needed for fever. Continue other inhalers. Gave written Rx for Prednisone 40 mg daily for 5 days if wheezing and respiratory symptoms worsen. Continue to push fluids. Note written for work. Follow up with her PCP in 3 to 4 days if not improving.  Final Clinical Impressions(s) / UC Diagnoses   Final diagnoses:  Influenza B  Cough  Personal history of asthma     Discharge Instructions     Recommend start Tamiflu 75mg  twice a day as directed. Continue to take Tylenol 1000mg  every 8 hours as needed for fever and body aches. Push fluids to stay well-hydrated. If cough and wheezing worsen in the next 2 to 3 days, may start Prednisone 40mg  daily for 5 days- Rx written. Follow-up with your PCP in 3 to 4 days if not improving.     ED Prescriptions    Medication Sig Dispense Auth. Provider   oseltamivir (TAMIFLU) 75 MG capsule Take 1 capsule (75 mg total) by mouth every 12 (twelve) hours for 5 days. 10 capsule Katy Apo, NP   predniSONE (DELTASONE) 20 MG tablet Take 2 tablets (40 mg total) by mouth daily for 5 days. 10 tablet Katy Apo, NP     Controlled Substance Prescriptions [place] Controlled Substance Registry consulted? N/A   Katy Apo, NP 08/23/18 [phone]

## 2018-08-23 NOTE — Discharge Instructions (Signed)
Recommend start Tamiflu 75mg  twice a day as directed. Continue to take Tylenol 1000mg  every 8 hours as needed for fever and body aches. Push fluids to stay well-hydrated. If cough and wheezing worsen in the next 2 to 3 days, may start Prednisone 40mg  daily for 5 days- Rx written. Follow-up with your PCP in 3 to 4 days if not improving.

## 2018-09-27 ENCOUNTER — Ambulatory Visit
Admission: EM | Admit: 2018-09-27 | Discharge: 2018-09-27 | Disposition: A | Discharge status: Home / Self Care | Payer: BC Managed Care – PPO | Attending: Family Medicine | Admitting: Family Medicine

## 2018-09-27 ENCOUNTER — Encounter: Payer: Self-pay | Admitting: Emergency Medicine

## 2018-09-27 ENCOUNTER — Other Ambulatory Visit: Payer: Self-pay

## 2018-09-27 DIAGNOSIS — J029 Acute pharyngitis, unspecified: Secondary | ICD-10-CM | POA: Diagnosis not present

## 2018-09-27 DIAGNOSIS — M791 Myalgia, unspecified site: Secondary | ICD-10-CM

## 2018-09-27 DIAGNOSIS — R05 Cough: Secondary | ICD-10-CM

## 2018-09-27 LAB — RAPID INFLUENZA A&B ANTIGENS (ARMC ONLY): INFLUENZA B (ARMC): NEGATIVE

## 2018-09-27 LAB — RAPID INFLUENZA A&B ANTIGENS: Influenza A (ARMC): NEGATIVE

## 2018-09-27 LAB — RAPID STREP SCREEN (MED CTR MEBANE ONLY): Streptococcus, Group A Screen (Direct): NEGATIVE

## 2018-09-27 MED ORDER — AZITHROMYCIN 500 MG PO TABS: 500.0000 mg | ORAL_TABLET | Freq: Every day | ORAL | 0 refills | Status: DC

## 2018-09-27 MED ORDER — ACETAMINOPHEN 500 MG PO TABS
1000.0000 mg | ORAL_TABLET | Freq: Once | ORAL | Status: AC
  Administered 2018-09-27: 1000 mg via ORAL

## 2018-09-27 NOTE — ED Provider Notes (Signed)
[facility]    CSN: [id number] Arrival date & time: 09/27/18  1623     History   Chief Complaint Chief Complaint  Patient presents with  . Sore Throat    HPI Leah Williams is a 38 y.o. female.   Onset of ST which woke her up at 3 am, and has continued getting worse since. Felt chills around 3 pm. Also as the day progressed developed body aches, fatigue, and HA. Denies rhinitis or cough. She is currently being treated for Otitis externa and TMJ inflammation for which she saw her PCP 3 days ago. Had been well prior to this illness. She had Flu B 6 weeks ago. 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 She states due to allergies, she always has drainage, and this is not any worse.      Past Medical History:  Diagnosis Date  . Anemia   . Asthma   . ITP (idiopathic thrombocytopenic purpura)     Patient Active Problem List   Diagnosis Date Noted  . Thrombocytosis ([place]) 04/15/2016  . Leukocytosis 04/15/2016    Past Surgical History:  Procedure Laterality Date  . APPENDECTOMY    . SPLENECTOMY, TOTAL    . TONSILLECTOMY      OB History   No obstetric history on file.      Home Medications    Prior to Admission medications   Medication Sig Start Date End Date Taking? Authorizing Provider  albuterol (PROVENTIL HFA;VENTOLIN HFA) 108 (90 Base) MCG/ACT inhaler Inhale 1-2 puffs into the lungs every 6 (six) hours as needed for wheezing or shortness of breath. 11/17/17  Yes Melynda Ripple, MD  ferrous sulfate 325 (65 FE) MG tablet Take 325 mg by mouth daily with breakfast.   Yes [provider]  Fluticasone-Salmeterol (ADVAIR) 250-50 MCG/DOSE AEPB Inhale 1 puff into the lungs 2 (two) times daily.   Yes [provider]  ibuprofen (ADVIL,MOTRIN) 600 MG tablet Take 1 tablet (600 mg total) by mouth every 6 (six) hours as needed. 11/17/17  Yes Melynda Ripple, MD    Ipratropium-Albuterol (COMBIVENT) 20-100 MCG/ACT AERS respimat Inhale 2 puffs into the lungs every 6 (six) hours. 11/17/17  Yes Melynda Ripple, MD  ofloxacin (FLOXIN) 0.3 % OTIC solution Place in ear(s). 09/25/18 10/05/18 Yes [provider]  valACYclovir (VALTREX) 1000 MG tablet Take by mouth. 01/02/18 01/02/19 Yes [provider]  naproxen (NAPROSYN) 500 MG tablet  09/25/18   [provider]  triamcinolone cream (KENALOG) 0.5 % Apply topically. 07/18/18 07/18/19  [provider]    Family History Family History  Problem Relation Age of Onset  . Skin cancer Mother   . Anemia Mother   . Hypertension Mother   . Diabetes Father   . Hypertension Father   . Hypertension Sister   . Diabetes Paternal Uncle   . Breast cancer Maternal Grandmother   . Brain cancer Maternal Grandfather   . Leukemia Paternal Grandfather     Social History Social History   Tobacco Use  . Smoking status: Never Smoker  . Smokeless tobacco: Never Used  Substance Use Topics  . Alcohol use: No  . Drug use: No     Allergies   Dexamethasone and Penicillins   Review of Systems Review of Systems  Constitutional: Positive for chills, fatigue and fever. Negative for activity change and diaphoresis.  HENT: Positive for sore throat and trouble  swallowing. Negative for congestion, postnasal drip, rhinorrhea and voice change.   Eyes: Negative for discharge.  Respiratory: Negative for cough and shortness of breath.   Cardiovascular: Negative for chest pain.  Gastrointestinal: Negative for nausea and vomiting.  Endocrine: Positive for polydipsia.       Has been very thirsty this pm since she has been chilling.   Genitourinary: Negative for difficulty urinating and dysuria.  Musculoskeletal: Positive for myalgias.  Skin: Negative for rash.  Allergic/Immunologic: Positive for environmental allergies.  Neurological: Positive for headaches.  Hematological: Negative for adenopathy.      Physical Exam Triage Vital Signs ED Triage Vitals  Enc Vitals Group     BP 09/27/18 1700 (!) 135/91     Pulse Rate 09/27/18 1700 (!) 116     Resp 09/27/18 1700 18     Temp 09/27/18 1700 100.2 F (37.9 C)     Temp Source 09/27/18 1700 Oral     SpO2 09/27/18 1700 99 %     Weight 09/27/18 1659 230 lb (104.3 kg)     Height 09/27/18 1659 5\' 5"  (1.651 m)     Head Circumference --      Peak Flow --      Pain Score 09/27/18 1659 8     Pain Loc --      Pain Edu? --      Excl. in [place]? --    No data found.  Updated Vital Signs BP (!) 135/91 (BP Location: Left Arm)   Pulse (!) 116   Temp 100.2 F (37.9 C) (Oral)   Resp 18   Ht 5\' 5"  (1.651 m)   Wt 230 lb (104.3 kg)   LMP 09/19/2018   SpO2 99%   BMI 38.27 kg/m   Visual Acuity Right Eye Distance:   Left Eye Distance:   Bilateral Distance:    Right Eye Near:   Left Eye Near:    Bilateral Near:     Physical Exam Vitals signs and nursing note reviewed.  Constitutional:      General: She is not in acute distress.    Appearance: She is ill-appearing. She is not toxic-appearing or diaphoretic.  HENT:     Head: Normocephalic.     Right Ear: Tympanic membrane and ear canal normal.     Left Ear: Tympanic membrane and ear canal normal.     Mouth/Throat:     Mouth: Mucous membranes are moist. No oral lesions.     Pharynx: Uvula midline. Posterior oropharyngeal erythema present. No pharyngeal swelling, oropharyngeal exudate or uvula swelling.  Eyes:     Conjunctiva/sclera: Conjunctivae normal.  Neck:     Musculoskeletal: Neck supple.  Cardiovascular:     Rate and Rhythm: Normal rate and regular rhythm.     Heart sounds: No murmur.  Pulmonary:     Effort: Pulmonary effort is normal.     Breath sounds: Normal breath sounds.  Lymphadenopathy:     Cervical: No cervical adenopathy.  Skin:    General: Skin is warm and dry.  Neurological:     Mental Status: She is alert.  Psychiatric:        Mood and Affect: Mood  normal.        Behavior: Behavior normal.    UC Treatments / Results  Labs (all labs ordered are listed, but only abnormal results are displayed) Labs Reviewed  RAPID STREP SCREEN ([facility] ONLY)  RAPID INFLUENZA A&B ANTIGENS ([facility] ONLY)  RAPID INFLUENZA A&B ANTIGENS [facility]  ONLY)    EKG None  Radiology No results found.  Procedures Procedures   Medications Ordered in UC Medications - No data to display  Initial Impression / Assessment and Plan / UC Course  I have reviewed the triage vital signs and the nursing notes. Pertinent labs  results that were available during my care of the patient were reviewed by me and considered in my medical decision making (see chart for details). Throat culture was sent out, and in the mean time I started her on Zithromax as noted.  Final Clinical Impressions(s) / UC Diagnoses   Final diagnoses:  None   Discharge Instructions   None    ED Prescriptions    None     Controlled Substance Prescriptions [place] Controlled Substance Registry consulted?    Shelby Mattocks, Hershal Coria 09/27/18 2139

## 2018-09-27 NOTE — ED Triage Notes (Signed)
Pt c/o sore throat, chills, malaise, and body aches. Started this morning and has gotten worse though out the day.

## 2018-09-30 LAB — CULTURE, GROUP A STREP (THRC)

## 2022-09-22 ENCOUNTER — Ambulatory Visit
Admission: EM | Admit: 2022-09-22 | Discharge: 2022-09-22 | Disposition: A | Discharge status: Home / Self Care | Payer: BC Managed Care – PPO

## 2022-09-22 ENCOUNTER — Encounter: Payer: Self-pay | Admitting: Emergency Medicine

## 2022-09-22 ENCOUNTER — Ambulatory Visit (INDEPENDENT_AMBULATORY_CARE_PROVIDER_SITE_OTHER): Payer: BC Managed Care – PPO

## 2022-09-22 DIAGNOSIS — J209 Acute bronchitis, unspecified: Secondary | ICD-10-CM | POA: Diagnosis not present

## 2022-09-22 MED ORDER — DOXYCYCLINE HYCLATE 100 MG PO CAPS: 100.0000 mg | ORAL_CAPSULE | Freq: Two times a day (BID) | ORAL | 0 refills | Status: AC

## 2022-09-22 MED ORDER — BENZONATATE 100 MG PO CAPS: 200.0000 mg | ORAL_CAPSULE | Freq: Three times a day (TID) | ORAL | 0 refills | Status: DC

## 2022-09-22 MED ORDER — PREDNISONE 10 MG (21) PO TBPK: ORAL_TABLET | ORAL | 0 refills | Status: DC

## 2022-09-22 MED ORDER — PROMETHAZINE-DM 6.25-15 MG/5ML PO SYRP: 5.0000 mL | ORAL_SOLUTION | Freq: Four times a day (QID) | ORAL | 0 refills | Status: DC | PRN

## 2022-09-22 NOTE — Discharge Instructions (Addendum)
Use the albuterol inhaler/nebulizer every 4-6 hours as needed for shortness of breath, wheezing, and cough.  Take the doxycycline, 100 mg twice daily with food, for 7 days for treatment of your bronchitis.  Take the prednisone according to the package instructions to help with pulmonary inflammation.  Use the Tessalon Perles every 8 hours for your cough.  Taken with a small sip of water.  They may give you some numbness to the base of your tongue or metallic taste in her mouth, this is normal.  They are designed to calm down the cough reflex.  Use the Promethazine DM cough syrup at bedtime as will make you drowsy.  You may take 1 teaspoon (5 mL) every 6 hours.  Return for reevaluation for new or worsening symptoms.

## 2022-09-22 NOTE — ED Triage Notes (Signed)
Pt presents with a cough, SOB and wheezing x 3 days. She reports taking 4 neb treatments in the past few days and its not helping.

## 2022-09-22 NOTE — ED Provider Notes (Signed)
[facility]    CSN: [id number] Arrival date & time: 09/22/22  1518      History   Chief Complaint Chief Complaint  Patient presents with   Cough   Wheezing    HPI Leah Williams is a 42 y.o. female.   HPI  42 year old female here for evaluation respiratory complaints.  The patient has a history of asthma and is presenting for 3-day history of cough, shortness breath, and wheezing.  She has not had any fever and she denies runny nose or nasal congestion.  Her cough is intermittently productive for a green sputum.  She has been using her albuterol nebulizer treatment at home twice daily with some relief of symptoms.  She takes Advair twice daily for control of her asthma.  She was most recently treated for a flare of her moderate persistent asthma on 09/08/2022 with prednisone, albuterol, and Promethazine DM cough syrup.  Past Medical History:  Diagnosis Date   Anemia    Asthma    ITP (idiopathic thrombocytopenic purpura)     Patient Active Problem List   Diagnosis Date Noted   Thrombocytosis 04/15/2016   Leukocytosis 04/15/2016    Past Surgical History:  Procedure Laterality Date   APPENDECTOMY     SPLENECTOMY, TOTAL     TONSILLECTOMY      OB History   No obstetric history on file.      Home Medications    Prior to Admission medications   Medication Sig Start Date End Date Taking? Authorizing Provider  albuterol (PROVENTIL HFA;VENTOLIN HFA) 108 (90 Base) MCG/ACT inhaler Inhale 1-2 puffs into the lungs every 6 (six) hours as needed for wheezing or shortness of breath. 11/17/17  Yes Melynda Ripple, MD  albuterol (PROVENTIL) (2.5 MG/3ML) 0.083% nebulizer solution Take 2.5 mg by nebulization every 6 (six) hours as needed for wheezing or shortness of breath.   Yes [provider]  benzonatate (TESSALON) 100 MG capsule Take 2 capsules (200 mg total) by mouth every 8 (eight) hours. 09/22/22  Yes Margarette Canada, NP  doxycycline (VIBRAMYCIN) 100 MG  capsule Take 1 capsule (100 mg total) by mouth 2 (two) times daily for 7 days. 09/22/22 09/29/22 Yes Margarette Canada, NP  ferrous sulfate 325 (65 FE) MG tablet Take 325 mg by mouth daily with breakfast.   Yes [provider]  Fluticasone-Salmeterol (ADVAIR) 250-50 MCG/DOSE AEPB Inhale 1 puff into the lungs 2 (two) times daily.   Yes [provider]  predniSONE (STERAPRED UNI-PAK 21 TAB) 10 MG (21) TBPK tablet Take 6 tablets on day 1, 5 tablets day 2, 4 tablets day 3, 3 tablets day 4, 2 tablets day 5, 1 tablet day 6 09/22/22  Yes Margarette Canada, NP  promethazine-dextromethorphan (PROMETHAZINE-DM) 6.25-15 MG/5ML syrup Take 5 mLs by mouth 4 (four) times daily as needed. 09/22/22  Yes Margarette Canada, NP  azithromycin (ZITHROMAX) 500 MG tablet Take 1 tablet (500 mg total) by mouth daily. 09/27/18   Rodriguez-Southworth, Sunday Spillers, PA-C  ibuprofen (ADVIL,MOTRIN) 600 MG tablet Take 1 tablet (600 mg total) by mouth every 6 (six) hours as needed. 11/17/17   Melynda Ripple, MD  Ipratropium-Albuterol (COMBIVENT) 20-100 MCG/ACT AERS respimat Inhale 2 puffs into the lungs every 6 (six) hours. 11/17/17   Melynda Ripple, MD  naproxen (NAPROSYN) 500 MG tablet  09/25/18   [provider]    Family History Family History  Problem Relation Age of Onset   Skin cancer Mother    Anemia Mother    Hypertension  Mother    Diabetes Father    Hypertension Father    Hypertension Sister    Diabetes Paternal Uncle    Breast cancer Maternal Grandmother    Brain cancer Maternal Grandfather    Leukemia Paternal Grandfather     Social History Social History   Tobacco Use   Smoking status: Never   Smokeless tobacco: Never  Vaping Use   Vaping Use: Never used  Substance Use Topics   Alcohol use: No   Drug use: No     Allergies   Dexamethasone and Penicillins   Review of Systems Review of Systems  Constitutional:  Negative for fever.  HENT:  Negative for congestion and rhinorrhea.    Respiratory:  Positive for cough, shortness of breath and wheezing.      Physical Exam Triage Vital Signs ED Triage Vitals  Enc Vitals Group     BP      Pulse      Resp      Temp      Temp src      SpO2      Weight      Height      Head Circumference      Peak Flow      Pain Score      Pain Loc      Pain Edu?      Excl. in [place]?    No data found.  Updated Vital Signs BP 130/87 (BP Location: Right Arm)   Pulse (!) 106   Temp 98.5 F (36.9 C) (Oral)   Resp 18   LMP 09/17/2022   SpO2 97%   Visual Acuity Right Eye Distance:   Left Eye Distance:   Bilateral Distance:    Right Eye Near:   Left Eye Near:    Bilateral Near:     Physical Exam Vitals and nursing note reviewed.  Constitutional:      Appearance: Normal appearance. She is not ill-appearing.  HENT:     Head: Normocephalic and atraumatic.  Cardiovascular:     Rate and Rhythm: Normal rate and regular rhythm.     Pulses: Normal pulses.     Heart sounds: Normal heart sounds. No murmur heard.    No friction rub. No gallop.  Pulmonary:     Effort: Pulmonary effort is normal.     Breath sounds: Wheezing and rhonchi present. No rales.  Skin:    General: Skin is warm and dry.     Capillary Refill: Capillary refill takes less than 2 seconds.     Findings: No erythema or rash.  Neurological:     General: No focal deficit present.     Mental Status: She is alert and oriented to person, place, and time.  Psychiatric:        Mood and Affect: Mood normal.        Behavior: Behavior normal.        Thought Content: Thought content normal.        Judgment: Judgment normal.      UC Treatments / Results  Labs (all labs ordered are listed, but only abnormal results are displayed) Labs Reviewed - No data to display  EKG   Radiology DG Chest 2 View  Result Date: 09/22/2022 CLINICAL DATA:  Cough EXAM: CHEST - 2 VIEW COMPARISON:  CXR 03/13/16 FINDINGS: The heart size and mediastinal contours are within  normal limits. Both lungs are clear. The visualized skeletal structures are unremarkable. IMPRESSION: No active cardiopulmonary disease.  Electronically Signed   By: Marin Roberts M.D.   On: 09/22/2022 16:20    Procedures Procedures (including critical care time)  Medications Ordered in UC Medications - No data to display  Initial Impression / Assessment and Plan / UC Course  I have reviewed the triage vital signs and the nursing notes.  Pertinent labs & imaging results that were available during my care of the patient were reviewed by me and considered in my medical decision making (see chart for details).   Patient is a pleasant, nontoxic-appearing 42 year old female here for evaluation of 3 days worth of respiratory symptoms as outlined in HPI above.  Patient is not in any acute respiratory distress here in clinic and she is able to speak in full sentences without dyspnea or tachypnea.  Her room air oxygen saturation is 97%.  Respiratory rate is 18.  She is afebrile at 98.5.  On exam patient has wheezes and rhonchi in her upper middle lung fields bilaterally.  I suspect the patient has bronchitis but given her intermittent sputum production I will also obtain a chest x-ray to rule out the presence of possible pneumonia.  Radiology impression of chest x-ray states no active cardiopulmonary disease.  I will discharge patient home with a diagnosis of bronchitis and started on doxycycline 100 mg twice daily for 7 days.  I will also do another prednisone burst dose of 60 mg daily for 5 days.  Tessalon Perles and Promethazine DM cough Ceptava with cough and congestion.   Final Clinical Impressions(s) / UC Diagnoses   Final diagnoses:  Acute bronchitis, unspecified organism     Discharge Instructions      Use the albuterol inhaler/nebulizer every 4-6 hours as needed for shortness of breath, wheezing, and cough.  Take the doxycycline, 100 mg twice daily with food, for 7 days for treatment  of your bronchitis.  Take the prednisone according to the package instructions to help with pulmonary inflammation.  Use the Tessalon Perles every 8 hours for your cough.  Taken with a small sip of water.  They may give you some numbness to the base of your tongue or metallic taste in her mouth, this is normal.  They are designed to calm down the cough reflex.  Use the Promethazine DM cough syrup at bedtime as will make you drowsy.  You may take 1 teaspoon (5 mL) every 6 hours.  Return for reevaluation for new or worsening symptoms.      ED Prescriptions     Medication Sig Dispense Auth. Provider   doxycycline (VIBRAMYCIN) 100 MG capsule Take 1 capsule (100 mg total) by mouth 2 (two) times daily for 7 days. 14 capsule Margarette Canada, NP   benzonatate (TESSALON) 100 MG capsule Take 2 capsules (200 mg total) by mouth every 8 (eight) hours. 21 capsule Margarette Canada, NP   promethazine-dextromethorphan (PROMETHAZINE-DM) 6.25-15 MG/5ML syrup Take 5 mLs by mouth 4 (four) times daily as needed. 118 mL Margarette Canada, NP   predniSONE (STERAPRED UNI-PAK 21 TAB) 10 MG (21) TBPK tablet Take 6 tablets on day 1, 5 tablets day 2, 4 tablets day 3, 3 tablets day 4, 2 tablets day 5, 1 tablet day 6 21 tablet Margarette Canada, NP      PDMP not reviewed this encounter.   Margarette Canada, NP 09/22/22 1640

## 2023-09-01 ENCOUNTER — Ambulatory Visit
Admission: EM | Admit: 2023-09-01 | Discharge: 2023-09-01 | Disposition: A | Discharge status: Home / Self Care | Payer: 59 | Attending: Emergency Medicine | Admitting: Emergency Medicine

## 2023-09-01 DIAGNOSIS — J069 Acute upper respiratory infection, unspecified: Secondary | ICD-10-CM | POA: Diagnosis not present

## 2023-09-01 DIAGNOSIS — J4521 Mild intermittent asthma with (acute) exacerbation: Secondary | ICD-10-CM

## 2023-09-01 MED ORDER — PREDNISONE 20 MG PO TABS: 60.0000 mg | ORAL_TABLET | Freq: Every day | ORAL | 0 refills | Status: AC

## 2023-09-01 MED ORDER — PROMETHAZINE-DM 6.25-15 MG/5ML PO SYRP: 5.0000 mL | ORAL_SOLUTION | Freq: Four times a day (QID) | ORAL | 0 refills | Status: AC | PRN

## 2023-09-01 MED ORDER — BENZONATATE 100 MG PO CAPS: 200.0000 mg | ORAL_CAPSULE | Freq: Three times a day (TID) | ORAL | 0 refills | Status: AC

## 2023-09-01 MED ORDER — METHYLPREDNISOLONE SODIUM SUCC 40 MG IJ SOLR: 40.0000 mg | Freq: Once | INTRAMUSCULAR | Status: DC

## 2023-09-01 MED ORDER — IPRATROPIUM BROMIDE 0.06 % NA SOLN: 2.0000 | Freq: Four times a day (QID) | NASAL | 12 refills | Status: AC

## 2023-09-01 MED ORDER — METHYLPREDNISOLONE ACETATE 80 MG/ML IJ SUSP
80.0000 mg | Freq: Once | INTRAMUSCULAR | Status: AC
  Administered 2023-09-01: 80 mg via INTRAMUSCULAR

## 2023-09-01 MED ORDER — IPRATROPIUM BROMIDE 0.06 % NA SOLN: 2.0000 | Freq: Four times a day (QID) | NASAL | 12 refills | Status: DC

## 2023-09-01 MED ORDER — BENZONATATE 100 MG PO CAPS: 200.0000 mg | ORAL_CAPSULE | Freq: Three times a day (TID) | ORAL | 0 refills | Status: DC

## 2023-09-01 MED ORDER — PROMETHAZINE-DM 6.25-15 MG/5ML PO SYRP: 5.0000 mL | ORAL_SOLUTION | Freq: Four times a day (QID) | ORAL | 0 refills | Status: DC | PRN

## 2023-09-01 MED ORDER — PREDNISONE 20 MG PO TABS: 60.0000 mg | ORAL_TABLET | Freq: Every day | ORAL | 0 refills | Status: DC

## 2023-09-01 NOTE — ED Provider Notes (Addendum)
[facility]    CSN: [id number] Arrival date & time: 09/01/23  1949      History   Chief Complaint Chief Complaint  Patient presents with   Asthma    HPI Leah Williams is a 43 y.o. female.   HPI  66-year-old female with past medical history significant for ITP, asthma, and anemia presents for evaluation of shortness breath and wheezing that started 30 minutes ago.  She does have a history of asthma.  She reports that she was coughing as she choked on her cough and that triggered her asthma flare.  She took 10 puffs of her rescue inhaler without her spacer and feels like it has not helped her symptoms.  She is currently tachycardic with a heart rate of 120 at triage.  Room her oxygen saturation is 97%.  Past Medical History:  Diagnosis Date   Anemia    Asthma    ITP (idiopathic thrombocytopenic purpura)     Patient Active Problem List   Diagnosis Date Noted   Thrombocytosis 04/15/2016   Leukocytosis 04/15/2016    Past Surgical History:  Procedure Laterality Date   APPENDECTOMY     SPLENECTOMY, TOTAL     TONSILLECTOMY      OB History   No obstetric history on file.      Home Medications    Prior to Admission medications   Medication Sig Start Date End Date Taking? Authorizing Provider  albuterol (PROVENTIL HFA;VENTOLIN HFA) 108 (90 Base) MCG/ACT inhaler Inhale 1-2 puffs into the lungs every 6 (six) hours as needed for wheezing or shortness of breath. 11/17/17  Yes Domenick Gong, MD  albuterol (PROVENTIL) (2.5 MG/3ML) 0.083% nebulizer solution Take 2.5 mg by nebulization every 6 (six) hours as needed for wheezing or shortness of breath.   Yes [provider]  ferrous sulfate 325 (65 FE) MG tablet Take 325 mg by mouth daily with breakfast.   Yes [provider]  Fluticasone-Salmeterol (ADVAIR) 250-50 MCG/DOSE AEPB Inhale 1 puff into the lungs 2 (two) times daily.   Yes [provider]  ibuprofen (ADVIL,MOTRIN) 600 MG tablet  Take 1 tablet (600 mg total) by mouth every 6 (six) hours as needed. 11/17/17  Yes Domenick Gong, MD  ipratropium (ATROVENT) 0.06 % nasal spray Place 2 sprays into both nostrils 4 (four) times daily. 09/01/23  Yes Becky Augusta, NP  Ipratropium-Albuterol (COMBIVENT) 20-100 MCG/ACT AERS respimat Inhale 2 puffs into the lungs every 6 (six) hours. 11/17/17  Yes Domenick Gong, MD  naproxen (NAPROSYN) 500 MG tablet  09/25/18  Yes [provider]  predniSONE (DELTASONE) 20 MG tablet Take 3 tablets (60 mg total) by mouth daily with breakfast for 5 days. 3 tablets daily for 5 days. 09/01/23 09/06/23 Yes Becky Augusta, NP  benzonatate (TESSALON) 100 MG capsule Take 2 capsules (200 mg total) by mouth every 8 (eight) hours. 09/01/23   Becky Augusta, NP  promethazine-dextromethorphan (PROMETHAZINE-DM) 6.25-15 MG/5ML syrup Take 5 mLs by mouth 4 (four) times daily as needed. 09/01/23   Becky Augusta, NP    Family History Family History  Problem Relation Age of Onset   Skin cancer Mother    Anemia Mother    Hypertension Mother    Diabetes Father    Hypertension Father    Hypertension Sister    Diabetes Paternal Uncle    Breast cancer Maternal Grandmother    Brain cancer Maternal Grandfather    Leukemia Paternal Grandfather     Social History Social History  Tobacco Use   Smoking status: Never   Smokeless tobacco: Never  Vaping Use   Vaping status: Never Used  Substance Use Topics   Alcohol use: No   Drug use: No     Allergies   Dexamethasone and Penicillins   Review of Systems Review of Systems  Constitutional:  Negative for fever.  HENT:  Positive for congestion and rhinorrhea.   Respiratory:  Positive for cough, shortness of breath and wheezing.      Physical Exam Triage Vital Signs ED Triage Vitals  Encounter Vitals Group     BP 09/01/23 1958 128/83     Systolic BP Percentile --      Diastolic BP Percentile --      Pulse Rate 09/01/23 1958 (!) 120     Resp --       Temp 09/01/23 1958 98.2 F (36.8 C)     Temp Source 09/01/23 1958 Oral     SpO2 09/01/23 1958 97 %     Weight 09/01/23 1956 240 lb (108.9 kg)     Height 09/01/23 1956 5\' 5"  (1.651 m)     Head Circumference --      Peak Flow --      Pain Score 09/01/23 1956 0     Pain Loc --      Pain Education --      Exclude from Growth Chart --    No data found.  Updated Vital Signs BP 128/83 (BP Location: Left Wrist)   Pulse (!) 120   Temp 98.2 F (36.8 C) (Oral)   Ht 5\' 5"  (1.651 m)   Wt 240 lb (108.9 kg)   LMP 08/18/2023   SpO2 97%   BMI 39.94 kg/m   Visual Acuity Right Eye Distance:   Left Eye Distance:   Bilateral Distance:    Right Eye Near:   Left Eye Near:    Bilateral Near:     Physical Exam Vitals and nursing note reviewed.  Constitutional:      Appearance: Normal appearance. She is not ill-appearing.  HENT:     Head: Normocephalic and atraumatic.     Right Ear: Tympanic membrane, ear canal and external ear normal. There is no impacted cerumen.     Left Ear: Tympanic membrane, ear canal and external ear normal. There is no impacted cerumen.     Nose: Congestion and rhinorrhea present.     Comments: This mucosa is erythematous and edematous with scant clear discharge in both nares.    Mouth/Throat:     Mouth: Mucous membranes are moist.     Pharynx: Oropharynx is clear. Posterior oropharyngeal erythema present. No oropharyngeal exudate.     Comments: Erythema to the posterior oropharynx with clear postnasal drip. Cardiovascular:     Rate and Rhythm: Normal rate and regular rhythm.     Pulses: Normal pulses.     Heart sounds: Normal heart sounds. No murmur heard.    No friction rub. No gallop.  Pulmonary:     Effort: Pulmonary effort is normal.     Breath sounds: Wheezing present. No rhonchi or rales.     Comments: Scattered expiratory wheezing. Musculoskeletal:     Cervical back: Normal range of motion and neck supple. No tenderness.  Lymphadenopathy:      Cervical: No cervical adenopathy.  Skin:    General: Skin is warm and dry.     Capillary Refill: Capillary refill takes less than 2 seconds.     Findings: No rash.  Neurological:     General: No focal deficit present.     Mental Status: She is alert and oriented to person, place, and time.      UC Treatments / Results  Labs (all labs ordered are listed, but only abnormal results are displayed) Labs Reviewed - No data to display  EKG   Radiology No results found.  Procedures Procedures (including critical care time)  Medications Ordered in UC Medications  methylPREDNISolone sodium succinate (SOLU-MEDROL) 40 mg/mL injection 40 mg (has no administration in time range)    Initial Impression / Assessment and Plan / UC Course  I have reviewed the triage vital signs and the nursing notes.  Pertinent labs & imaging results that were available during my care of the patient were reviewed by me and considered in my medical decision making (see chart for details).   Patient is a nontoxic-appearing 43 year old female presenting for evaluation of respiratory symptoms as outlined in HPI above.  She reports that she has been experiencing runny nose, nasal congestion, and a nonproductive cough for the last several days and while experiencing a coughing fit today she developed an acute asthma flare with shortness of breath and wheezing.  She took 10 puffs of her rescue inhaler and reports that it has not helped her symptoms.  However, her oxygen saturation is 97% on room air and she is able to speak in full sentence about dyspnea or tachypnea.  Cardiopulmonary exam reveals scattered expiratory wheezing but otherwise good lung movement and normal chest excursion.  Evaluation of her upper respiratory tract does reveal inflamed nasal mucosa with scant clear nasal discharge as well as erythema to the posterior oropharynx with clear postnasal drip.  I suspect that the patient has a viral URI which has  triggered her asthma flare.  I will have staff administer 80 mg of IM Depo-Medrol here in clinic and discharge her home on a 5-day burst dose of 60 mg prednisone to start tomorrow.  She should continue to use her albuterol inhaler as needed for shortness breath and wheezing.  I will also prescribe Atrovent nasal spray to help with nasal congestion and postnasal drip and Tessalon Perles and Promethazine DM cough syrup for cough and congestion.   Final Clinical Impressions(s) / UC Diagnoses   Final diagnoses:  Viral upper respiratory tract infection  Mild intermittent asthma with exacerbation     Discharge Instructions      Continue to use your albuterol inhaler, with the spacer, 1 to 2 puffs every 4 6 hours as needed for shortness breath and wheezing.  Starting tomorrow morning at breakfast take prednisone 60 mg.  Will take it each day for 5 days to help decrease airway inflammation.  Use the Atrovent nasal spray, 2 squirts in each nostril every 6 hours, as needed for runny nose and postnasal drip.  Use the Tessalon Perles every 8 hours during the day.  Take them with a small sip of water.  They may give you some numbness to the base of your tongue or a metallic taste in your mouth, this is normal.  Use the Promethazine DM cough syrup at bedtime for cough and congestion.  It will make you drowsy so do not take it during the day.  Return for reevaluation or see your primary care provider for any new or worsening symptoms.      ED Prescriptions     Medication Sig Dispense Auth. Provider   benzonatate (TESSALON) 100 MG capsule Take 2 capsules (200  mg total) by mouth every 8 (eight) hours. 21 capsule Becky Augusta, NP   promethazine-dextromethorphan (PROMETHAZINE-DM) 6.25-15 MG/5ML syrup Take 5 mLs by mouth 4 (four) times daily as needed. 118 mL Becky Augusta, NP   ipratropium (ATROVENT) 0.06 % nasal spray Place 2 sprays into both nostrils 4 (four) times daily. 15 mL Becky Augusta, NP    predniSONE (DELTASONE) 20 MG tablet Take 3 tablets (60 mg total) by mouth daily with breakfast for 5 days. 3 tablets daily for 5 days. 15 tablet Becky Augusta, NP      PDMP not reviewed this encounter.   Becky Augusta, NP 09/01/23 2015    Becky Augusta, NP 09/01/23 2021

## 2023-09-01 NOTE — Discharge Instructions (Addendum)
Continue to use your albuterol inhaler, with the spacer, 1 to 2 puffs every 4 6 hours as needed for shortness breath and wheezing.  Starting tomorrow morning at breakfast take prednisone 60 mg.  Will take it each day for 5 days to help decrease airway inflammation.  Use the Atrovent nasal spray, 2 squirts in each nostril every 6 hours, as needed for runny nose and postnasal drip.  Use the Tessalon Perles every 8 hours during the day.  Take them with a small sip of water.  They may give you some numbness to the base of your tongue or a metallic taste in your mouth, this is normal.  Use the Promethazine DM cough syrup at bedtime for cough and congestion.  It will make you drowsy so do not take it during the day.  Return for reevaluation or see your primary care provider for any new or worsening symptoms.

## 2023-09-01 NOTE — ED Triage Notes (Addendum)
Pt c/o wheezing and SOB x32minutes  Pt has a history of asthma  Pt took 10 puffs from her rescue inhaler and it was not helpful  Pt denies any mouth or throat swelling  Pt states that she was coughing and choked on her own cough and that caused the asthma flare up.
